# Patient Record
Sex: Female | Born: 1950
Health system: Southern US, Community
[De-identification: ages and names within clinical notes are randomized; demographics above are authoritative.]

## PROBLEM LIST (undated history)

## (undated) DIAGNOSIS — E039 Hypothyroidism, unspecified: Secondary | ICD-10-CM

## (undated) DIAGNOSIS — C541 Malignant neoplasm of endometrium: Secondary | ICD-10-CM

## (undated) DIAGNOSIS — Z860100 Personal history of colon polyps, unspecified: Secondary | ICD-10-CM

## (undated) DIAGNOSIS — N950 Postmenopausal bleeding: Secondary | ICD-10-CM

## (undated) DIAGNOSIS — C55 Malignant neoplasm of uterus, part unspecified: Secondary | ICD-10-CM

## (undated) HISTORY — DX: Personal history of colon polyps, unspecified: Z86.0100

## (undated) HISTORY — DX: Hypothyroidism, unspecified: E03.9

## (undated) HISTORY — DX: Personal history of colonic polyps: Z86.010

---

## 2005-05-17 HISTORY — PX: COLONOSCOPY: SHX174

## 2005-11-22 ENCOUNTER — Ambulatory Visit: Payer: Self-pay | Admitting: Family Medicine

## 2005-12-14 ENCOUNTER — Ambulatory Visit: Payer: Self-pay | Admitting: Family Medicine

## 2005-12-15 ENCOUNTER — Ambulatory Visit: Payer: Self-pay | Admitting: Internal Medicine

## 2005-12-28 ENCOUNTER — Encounter: Payer: Self-pay | Admitting: Gastroenterology

## 2005-12-28 ENCOUNTER — Ambulatory Visit: Payer: Self-pay | Admitting: Gastroenterology

## 2005-12-28 LAB — HM COLONOSCOPY

## 2007-01-24 ENCOUNTER — Ambulatory Visit: Payer: Self-pay | Admitting: Family Medicine

## 2008-03-06 ENCOUNTER — Ambulatory Visit: Payer: Self-pay | Admitting: Family Medicine

## 2008-07-09 ENCOUNTER — Ambulatory Visit: Payer: Self-pay | Admitting: Family Medicine

## 2008-11-26 ENCOUNTER — Ambulatory Visit: Payer: Self-pay | Admitting: Family Medicine

## 2009-02-26 ENCOUNTER — Ambulatory Visit: Payer: Self-pay | Admitting: Family Medicine

## 2010-04-21 ENCOUNTER — Ambulatory Visit: Payer: Self-pay | Admitting: Family Medicine

## 2011-01-11 ENCOUNTER — Encounter: Payer: Self-pay | Admitting: Gastroenterology

## 2011-04-28 ENCOUNTER — Telehealth: Payer: Self-pay | Admitting: Family Medicine

## 2011-04-28 MED ORDER — LEVOTHYROXINE SODIUM 137 MCG PO TABS: 137.0000 ug | ORAL_TABLET | Freq: Every day | ORAL | Status: DC

## 2011-04-28 NOTE — Telephone Encounter (Signed)
Med sent in.

## 2011-04-28 NOTE — Telephone Encounter (Signed)
Med called in

## 2011-05-27 ENCOUNTER — Encounter: Payer: Self-pay | Admitting: Internal Medicine

## 2011-06-04 ENCOUNTER — Encounter: Payer: Self-pay | Admitting: Family Medicine

## 2011-06-04 ENCOUNTER — Telehealth: Payer: Self-pay | Admitting: Internal Medicine

## 2011-06-04 MED ORDER — LEVOTHYROXINE SODIUM 137 MCG PO TABS: 137.0000 ug | ORAL_TABLET | Freq: Every day | ORAL | Status: DC

## 2011-06-04 NOTE — Telephone Encounter (Signed)
[facility] [place]

## 2011-06-05 ENCOUNTER — Other Ambulatory Visit: Payer: Self-pay | Admitting: Family Medicine

## 2011-06-18 ENCOUNTER — Encounter: Payer: Self-pay | Admitting: Family Medicine

## 2011-06-25 ENCOUNTER — Ambulatory Visit (INDEPENDENT_AMBULATORY_CARE_PROVIDER_SITE_OTHER): Payer: 59 | Admitting: Family Medicine

## 2011-06-25 ENCOUNTER — Encounter: Payer: Self-pay | Admitting: Family Medicine

## 2011-06-25 ENCOUNTER — Other Ambulatory Visit (HOSPITAL_COMMUNITY)
Admission: RE | Admit: 2011-06-25 | Discharge: 2011-06-25 | Disposition: A | Discharge status: Home / Self Care | Payer: 59 | Source: Ambulatory Visit | Attending: Family Medicine | Admitting: Family Medicine

## 2011-06-25 DIAGNOSIS — Z23 Encounter for immunization: Secondary | ICD-10-CM

## 2011-06-25 DIAGNOSIS — Z01419 Encounter for gynecological examination (general) (routine) without abnormal findings: Secondary | ICD-10-CM | POA: Insufficient documentation

## 2011-06-25 DIAGNOSIS — Z0000 Encounter for general adult medical examination without abnormal findings: Secondary | ICD-10-CM

## 2011-06-25 DIAGNOSIS — E039 Hypothyroidism, unspecified: Secondary | ICD-10-CM

## 2011-06-25 LAB — POC HEMOCCULT BLD/STL (OFFICE/1-CARD/DIAGNOSTIC): Fecal Occult Blood, POC: NEGATIVE

## 2011-06-25 NOTE — Progress Notes (Signed)
  Subjective:    Patient ID: Diane [name], female    DOB: Nov 25, 1950, 61 y.o.   MRN: [id number]  HPI She is here for a complete examination. She was married last May and now has health insurance. She has no particular concerns or complaints. She continues on Synthroid and is having no problems with that. She has not had Pap, pelvic or mammogram.   Review of Systems  Constitutional: Negative.   HENT: Negative.   Eyes: Negative.   Respiratory: Negative.   Cardiovascular: Negative.   Gastrointestinal: Negative.   Genitourinary: Negative.   Musculoskeletal: Negative.   Skin: Negative.   Neurological: Negative.   Hematological: Negative.   Psychiatric/Behavioral: Negative.        Objective:   Physical Exam BP 118/70  Pulse 76  Ht 5' 7.5" (1.715 m)  Wt 128 lb (58.06 kg)  BMI 19.75 kg/m2  General Appearance:    Alert, cooperative, no distress, appears stated age  Head:    Normocephalic, without obvious abnormality, atraumatic  Eyes:    PERRL, conjunctiva/corneas clear, EOM's intact, fundi    benign  Ears:    Normal TM's and external ear canals  Nose:   Nares normal, mucosa normal, no drainage or sinus   tenderness  Throat:   Lips, mucosa, and tongue normal; teeth and gums normal  Neck:   Supple, no lymphadenopathy;  thyroid:  no   enlargement/tenderness/nodules; no carotid   bruit or JVD  Back:    Spine nontender, no curvature, ROM normal, no CVA     tenderness  Lungs:     Clear to auscultation bilaterally without wheezes, rales or     ronchi; respirations unlabored  Chest Wall:    No tenderness or deformity   Heart:    Regular rate and rhythm, S1 and S2 normal, no murmur, rub   or gallop  Breast Exam:   deferred   Abdomen:     Soft, non-tender, nondistended, normoactive bowel sounds,    no masses, no hepatosplenomegaly  Genitalia:    Normal external genitalia without lesions.  BUS and vagina normal; cervix without lesions, or cervical motion tenderness. No abnormal vaginal  discharge.  Uterus and adnexa not enlarged, nontender, no masses.  Pap performed  Rectal:    Normal tone, no masses or tenderness; guaiac negative stool  Extremities:   No clubbing, cyanosis or edema  Pulses:   2+ and symmetric all extremities  Skin:   Skin color, texture, turgor normal, no rashes or lesions  Lymph nodes:   Cervical, supraclavicular, and axillary nodes normal  Neurologic:   CNII-XII intact, normal strength, sensation and gait; reflexes 2+ and symmetric throughout          Psych:   Normal mood, affect, hygiene and grooming.          Assessment & Plan:   1. Routine general medical examination at a health care facility  CBC with Differential, Comprehensive metabolic panel, Lipid panel, POCT Hemoccult (POC) Blood/Stool Test, Cytology - PAP, MM Digital Screening  2. Hypothyroid  TSH

## 2011-06-26 LAB — COMPREHENSIVE METABOLIC PANEL
ALT: 18 U/L (ref 0–35)
AST: 26 U/L (ref 0–37)
Albumin: 4.5 g/dL (ref 3.5–5.2)
BUN: 17 mg/dL (ref 6–23)
CO2: 26 mEq/L (ref 19–32)
Calcium: 9.7 mg/dL (ref 8.4–10.5)
Chloride: 102 mEq/L (ref 96–112)
Potassium: 4 mEq/L (ref 3.5–5.3)

## 2011-06-26 LAB — CBC WITH DIFFERENTIAL/PLATELET
Basophils Absolute: 0 10*3/uL (ref 0.0–0.1)
HCT: 43 % (ref 36.0–46.0)
Lymphocytes Relative: 25 % (ref 12–46)
Lymphs Abs: 1.1 10*3/uL (ref 0.7–4.0)
MCV: 97.7 fL (ref 78.0–100.0)
Monocytes Absolute: 0.4 10*3/uL (ref 0.1–1.0)
Neutro Abs: 2.7 10*3/uL (ref 1.7–7.7)
RBC: 4.4 MIL/uL (ref 3.87–5.11)
RDW: 13.4 % (ref 11.5–15.5)
WBC: 4.3 10*3/uL (ref 4.0–10.5)

## 2011-06-26 LAB — TSH: TSH: 3.329 u[IU]/mL (ref 0.350–4.500)

## 2011-06-26 LAB — LIPID PANEL: Cholesterol: 223 mg/dL — ABNORMAL HIGH (ref 0–200)

## 2011-06-28 NOTE — Progress Notes (Signed)
Quick Note:  The blood work is normal ______ 

## 2011-08-20 LAB — HM MAMMOGRAPHY

## 2011-08-30 ENCOUNTER — Encounter: Payer: Self-pay | Admitting: Family Medicine

## 2011-11-08 ENCOUNTER — Telehealth: Payer: Self-pay | Admitting: Family Medicine

## 2011-11-08 MED ORDER — LEVOTHYROXINE SODIUM 137 MCG PO TABS: 137.0000 ug | ORAL_TABLET | Freq: Every day | ORAL | Status: DC

## 2011-11-08 NOTE — Telephone Encounter (Signed)
Let her know that the med was renewed for one-year

## 2011-11-08 NOTE — Telephone Encounter (Signed)
Called pt to let her know med renewed for 1 yr

## 2011-11-08 NOTE — Telephone Encounter (Signed)
PT STATES THIS MED IS USUALLY FILLED FOR A YEAR AND AT LAST VISIT IT WASN'T. I DIDN'T SEE ANYTHING COMMENTED ON IN RECORDS AS TO WHY.  PT NEEDS REFILL ON SYNTHROID SENT TO [facility]

## 2011-11-20 ENCOUNTER — Other Ambulatory Visit: Payer: Self-pay | Admitting: Family Medicine

## 2012-08-18 ENCOUNTER — Other Ambulatory Visit: Payer: BC Managed Care – PPO

## 2012-08-18 DIAGNOSIS — B351 Tinea unguium: Secondary | ICD-10-CM

## 2012-08-18 LAB — CBC WITH DIFFERENTIAL/PLATELET
Basophils Absolute: 0 10*3/uL (ref 0.0–0.1)
Basophils Relative: 1 % (ref 0–1)
Hemoglobin: 12.6 g/dL (ref 12.0–15.0)
MCHC: 33.1 g/dL (ref 30.0–36.0)
Monocytes Relative: 8 % (ref 3–12)
Neutro Abs: 2.3 10*3/uL (ref 1.7–7.7)
Neutrophils Relative %: 59 % (ref 43–77)
WBC: 3.9 10*3/uL — ABNORMAL LOW (ref 4.0–10.5)

## 2012-08-18 LAB — COMPREHENSIVE METABOLIC PANEL
AST: 32 U/L (ref 0–37)
Albumin: 4.2 g/dL (ref 3.5–5.2)
Alkaline Phosphatase: 56 U/L (ref 39–117)
Glucose, Bld: 64 mg/dL — ABNORMAL LOW (ref 70–99)
Potassium: 4 mEq/L (ref 3.5–5.3)
Sodium: 142 mEq/L (ref 135–145)
Total Protein: 6.2 g/dL (ref 6.0–8.3)

## 2012-08-28 ENCOUNTER — Telehealth: Payer: Self-pay | Admitting: Family Medicine

## 2012-08-28 NOTE — Telephone Encounter (Signed)
dt ?

## 2012-11-22 ENCOUNTER — Telehealth: Payer: Self-pay | Admitting: Internal Medicine

## 2012-11-22 NOTE — Telephone Encounter (Signed)
Pt has an appt for a physical august 15 but needs a refill on her synthroid to get to her appt .Marland Kitchen [facility] [place]

## 2012-11-23 ENCOUNTER — Other Ambulatory Visit: Payer: Self-pay

## 2012-11-23 MED ORDER — LEVOTHYROXINE SODIUM 137 MCG PO TABS: ORAL_TABLET | ORAL | Status: DC

## 2012-11-23 NOTE — Telephone Encounter (Signed)
SENT IN THYROID MED TO [facility] TILL APPT.

## 2012-11-28 ENCOUNTER — Other Ambulatory Visit: Payer: Self-pay | Admitting: Family Medicine

## 2012-12-29 ENCOUNTER — Ambulatory Visit (INDEPENDENT_AMBULATORY_CARE_PROVIDER_SITE_OTHER): Payer: BC Managed Care – PPO | Admitting: Family Medicine

## 2012-12-29 ENCOUNTER — Encounter: Payer: Self-pay | Admitting: Family Medicine

## 2012-12-29 VITALS — BP 120/60 | HR 77 | Ht 67.5 in | Wt 124.0 lb

## 2012-12-29 DIAGNOSIS — E039 Hypothyroidism, unspecified: Secondary | ICD-10-CM

## 2012-12-29 DIAGNOSIS — Z0000 Encounter for general adult medical examination without abnormal findings: Secondary | ICD-10-CM

## 2012-12-29 LAB — CBC WITH DIFFERENTIAL/PLATELET
Basophils Absolute: 0 10*3/uL (ref 0.0–0.1)
Basophils Relative: 1 % (ref 0–1)
Eosinophils Absolute: 0.1 10*3/uL (ref 0.0–0.7)
MCH: 32.4 pg (ref 26.0–34.0)
MCHC: 34.4 g/dL (ref 30.0–36.0)
Neutro Abs: 1.9 10*3/uL (ref 1.7–7.7)
Neutrophils Relative %: 56 % (ref 43–77)
Platelets: 214 10*3/uL (ref 150–400)
RDW: 14.1 % (ref 11.5–15.5)

## 2012-12-29 NOTE — Progress Notes (Signed)
  Subjective:    Patient ID: Diane [name], female    DOB: 03-May-1951, 62 y.o.   MRN: [id number]  HPI He is here for complete examination. She has no particular concerns or complaints. She continues on her thyroid medication and is having no difficulty with that. Social and family history were reviewed. She is married for the third time and has been married this time for 2 years. It is going quite well. She seems very happy. 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   Review of Systems  Constitutional: Negative.   HENT: Negative.   Eyes: Negative.   Respiratory: Negative.   Cardiovascular: Negative.   Gastrointestinal: Negative.   Endocrine: Negative.   Genitourinary: Negative.   Musculoskeletal: Negative.   Allergic/Immunologic: Negative.   Neurological: Negative.   Hematological: Negative.   Psychiatric/Behavioral: Negative.        Objective:   Physical Exam BP 120/60  Pulse 77  Ht 5' 7.5" (1.715 m)  Wt 124 lb (56.246 kg)  BMI 19.12 kg/m2  General Appearance:    Alert, cooperative, no distress, appears stated age  Head:    Normocephalic, without obvious abnormality, atraumatic  Eyes:    PERRL, conjunctiva/corneas clear, EOM's intact, fundi    benign  Ears:    Normal TM's and external ear canals  Nose:   Nares normal, mucosa normal, no drainage or sinus   tenderness  Throat:   Lips, mucosa, and tongue normal; teeth and gums normal  Neck:   Supple, no lymphadenopathy;  thyroid:  no   enlargement/tenderness/nodules; no carotid   bruit or JVD  Back:    Spine nontender, no curvature, ROM normal, no CVA     tenderness  Lungs:     Clear to auscultation bilaterally without wheezes, rales or     ronchi; respirations unlabored  Chest Wall:    No tenderness or deformity   Heart:    Regular rate and rhythm, S1 and S2 normal, no murmur, rub   or gallop  Breast Exam:    Deferred to GYN  Abdomen:     Soft, non-tender, nondistended, normoactive bowel sounds,    no masses, no  hepatosplenomegaly  Genitalia:    Deferred to GYN     Extremities:   No clubbing, cyanosis or edema  Pulses:   2+ and symmetric all extremities  Skin:   Skin color, texture, turgor normal, no rashes or lesions  Lymph nodes:   Cervical, supraclavicular, and axillary nodes normal  Neurologic:   CNII-XII intact, normal strength, sensation and gait; reflexes 2+ and symmetric throughout          Psych:   Normal mood, affect, hygiene and grooming.          Assessment & Plan:  Routine general medical examination at a health care facility - Plan: CBC with Differential, Comprehensive metabolic panel, Lipid panel  Hypothyroid - Plan: TSH

## 2012-12-30 LAB — COMPREHENSIVE METABOLIC PANEL
AST: 42 U/L — ABNORMAL HIGH (ref 0–37)
Albumin: 4.3 g/dL (ref 3.5–5.2)
Alkaline Phosphatase: 59 U/L (ref 39–117)
Potassium: 3.8 mEq/L (ref 3.5–5.3)
Sodium: 139 mEq/L (ref 135–145)
Total Bilirubin: 0.6 mg/dL (ref 0.3–1.2)
Total Protein: 6.6 g/dL (ref 6.0–8.3)

## 2012-12-30 LAB — TSH: TSH: 2.485 u[IU]/mL (ref 0.350–4.500)

## 2012-12-30 LAB — LIPID PANEL
HDL: 83 mg/dL (ref >39)
LDL Cholesterol: 96 mg/dL (ref 0–99)
Triglycerides: 59 mg/dL (ref <150)
VLDL: 12 mg/dL (ref 0–40)

## 2013-11-07 ENCOUNTER — Encounter: Payer: Self-pay | Admitting: Medical

## 2013-11-07 ENCOUNTER — Ambulatory Visit (INDEPENDENT_AMBULATORY_CARE_PROVIDER_SITE_OTHER): Payer: BC Managed Care – PPO | Admitting: Medical

## 2013-11-07 VITALS — BP 92/60 | HR 68 | Temp 97.9°F | Resp 14 | Wt 125.0 lb

## 2013-11-07 DIAGNOSIS — E039 Hypothyroidism, unspecified: Secondary | ICD-10-CM

## 2013-11-07 DIAGNOSIS — R5381 Other malaise: Secondary | ICD-10-CM

## 2013-11-07 DIAGNOSIS — R5383 Other fatigue: Secondary | ICD-10-CM

## 2013-11-07 LAB — BASIC METABOLIC PANEL
BUN: 16 mg/dL (ref 6–23)
CHLORIDE: 105 meq/L (ref 96–112)
CO2: 29 mEq/L (ref 19–32)
Calcium: 9.6 mg/dL (ref 8.4–10.5)
Creat: 0.89 mg/dL (ref 0.50–1.10)
GLUCOSE: 88 mg/dL (ref 70–99)
POTASSIUM: 5 meq/L (ref 3.5–5.3)
Sodium: 141 mEq/L (ref 135–145)

## 2013-11-07 LAB — CBC
HEMATOCRIT: 39.5 % (ref 36.0–46.0)
Hemoglobin: 13.3 g/dL (ref 12.0–15.0)
MCH: 32 pg (ref 26.0–34.0)
MCHC: 33.7 g/dL (ref 30.0–36.0)
MCV: 95.2 fL (ref 78.0–100.0)
Platelets: 185 10*3/uL (ref 150–400)
RBC: 4.15 MIL/uL (ref 3.87–5.11)
RDW: 14.3 % (ref 11.5–15.5)
WBC: 3.7 10*3/uL — AB (ref 4.0–10.5)

## 2013-11-07 LAB — TSH: TSH: 1.221 u[IU]/mL (ref 0.350–4.500)

## 2013-11-07 LAB — T4, FREE: FREE T4: 1.38 ng/dL (ref 0.80–1.80)

## 2013-11-07 NOTE — Progress Notes (Addendum)
   Subjective:   Diane [name] is a 63 y.o. female presenting on 11/07/2013 with thyroid check, fatigue, weak legs  Here for recheck on thyroid.  normally sees Dr. Redmond School here.  For the past several months, feeling tired, heavy feeling.  Has had this in the past when first diagnosed with thyroid issues.  No other symptoms.  Exercises regularly, no CP, DOE, no leg swelling, no dyspnea.  Legs feel heavy.  Has been on Synthroid 164mcg, name brand, for a while now, probably 3 years.  Was on 163mcg in the past for a long time.   Denies depression, mood changes.   Married.   Eats healthy.   Works Education administrator houses.   No other aggravating or relieving factors.  No other complaint.  Review of Systems Constitutional: -fever, -chills, -sweats, -unexpected weight change, +fatigue ENT: -runny nose, -ear pain, -sore throat Cardiology:  -chest pain, -palpitations, -edema Respiratory: -cough, -shortness of breath, -wheezing Gastroenterology: -abdominal pain, -nausea, -vomiting, -diarrhea, -constipation  Hematology: -bleeding or bruising problems Musculoskeletal: -arthralgias, -myalgias, -joint swelling, -back pain Ophthalmology: -vision changes Urology: -dysuria, -difficulty urinating, -hematuria, -urinary frequency, -urgency Neurology: -headache, -weakness, -tingling, -numbness No vaginal bleeding.       Objective:    BP 92/60  Pulse 68  Temp(Src) 97.9 F (36.6 C) (Oral)  Resp 14  Wt 125 lb (56.7 kg) Wt Readings from Last 3 Encounters:  11/07/13 125 lb (56.7 kg)  12/29/12 124 lb (56.246 kg)  06/25/11 128 lb (58.06 kg)    General appearance: alert, no distress, WD/WN, lean white female HEENT: normocephalic, sclerae anicteric, TMs pearly, nares patent, no discharge or erythema, pharynx normal Oral cavity: MMM, no lesions Neck: supple, no lymphadenopathy, no thyromegaly, no masses Heart: RRR, normal S1, S2, no murmurs Lungs: CTA bilaterally, no wheezes, rhonchi, or rales Abdomen: +bs, soft,  non tender, non distended, no masses, no hepatomegaly, no splenomegaly Pulses: 1+ symmetric, upper and lower extremities, normal cap refill Ext: no edema    Adult ECG Report  Indication: fatigue  Rate: 56bpm  Rhythm: sinus bradycardia  QRS Axis: 82 degrees  PR Interval: 19ms  QRS Duration: 37ms  QTc: 42ms  Conduction Disturbances: RSR' in V1, V2, possible incomplete RBBB, otherwise good R wave progression, no other worrisome changes  Other Abnormalities: none  Patient's cardiac risk factors are: none.  EKG comparison: none  Narrative Interpretation: sinus bradycardia, otherwise normal EKG      Assessment: Encounter Diagnoses  Name Primary?  Marland Kitchen Unspecified hypothyroidism Yes  . Other malaise and fatigue      Plan: She seems to be compliant with medication, taking it properly.  No recent diet, exercise or lifestyle changes. EKG and labs today to further eval.  F/u pending studies.  Diane [name] was seen today for thyroid check, fatigue, weak legs.  Diagnoses and associated orders for this visit:  Unspecified hypothyroidism - CBC - TSH - Basic metabolic panel - T4, free - EKG 12-Lead - PR ELECTROCARDIOGRAM, COMPLETE  Other malaise and fatigue - CBC - TSH - Basic metabolic panel - T4, free - EKG 12-Lead - PR ELECTROCARDIOGRAM, COMPLETE    Return pending labs.

## 2013-11-09 ENCOUNTER — Other Ambulatory Visit: Payer: Self-pay | Admitting: Medical

## 2013-11-09 MED ORDER — LEVOTHYROXINE SODIUM 150 MCG PO TABS: 150.0000 ug | ORAL_TABLET | Freq: Every day | ORAL | Status: DC

## 2014-01-29 ENCOUNTER — Ambulatory Visit (INDEPENDENT_AMBULATORY_CARE_PROVIDER_SITE_OTHER): Payer: BC Managed Care – PPO | Admitting: Medical

## 2014-01-29 ENCOUNTER — Encounter: Payer: Self-pay | Admitting: Medical

## 2014-01-29 VITALS — BP 110/80 | HR 56 | Temp 97.7°F | Resp 14 | Wt 126.0 lb

## 2014-01-29 DIAGNOSIS — E039 Hypothyroidism, unspecified: Secondary | ICD-10-CM

## 2014-01-29 DIAGNOSIS — Z2821 Immunization not carried out because of patient refusal: Secondary | ICD-10-CM

## 2014-01-29 DIAGNOSIS — R5383 Other fatigue: Secondary | ICD-10-CM

## 2014-01-29 DIAGNOSIS — R5381 Other malaise: Secondary | ICD-10-CM

## 2014-01-29 DIAGNOSIS — Z7189 Other specified counseling: Secondary | ICD-10-CM

## 2014-01-29 MED ORDER — LEVOTHYROXINE SODIUM 150 MCG PO TABS: 150.0000 ug | ORAL_TABLET | Freq: Every day | ORAL | Status: DC

## 2014-01-29 NOTE — Patient Instructions (Signed)
Look into Shingles vaccine  Consider Bone Density scan at your next physical  Try and take 1000 IU Vit D daily, and 1200mg  Calcium daily.   Osteoporosis Throughout your life, your body breaks down old bone and replaces it with new bone. As you get older, your body does not replace bone as quickly as it breaks it down. By the age of 74 years, most people begin to gradually lose bone because of the imbalance between bone loss and replacement. Some people lose more bone than others. Bone loss beyond a specified normal degree is considered osteoporosis.  Osteoporosis affects the strength and durability of your bones. The inside of the ends of your bones and your flat bones, like the bones of your pelvis, look like honeycomb, filled with tiny open spaces. As bone loss occurs, your bones become less dense. This means that the open spaces inside your bones become bigger and the walls between these spaces become thinner. This makes your bones weaker. Bones of a person with osteoporosis can become so weak that they can break (fracture) during minor accidents, such as a simple fall. CAUSES  The following factors have been associated with the development of osteoporosis:  Smoking.  Drinking more than 2 alcoholic drinks several days per week.  Long-term use of certain medicines:  Corticosteroids.  Chemotherapy medicines.  Thyroid medicines.  Antiepileptic medicines.  Gonadal hormone suppression medicine.  Immunosuppression medicine.  Being underweight.  Lack of physical activity.  Lack of exposure to the sun. This can lead to vitamin D deficiency.  Certain medical conditions:  Certain inflammatory bowel diseases, such as Crohn disease and ulcerative colitis.  Diabetes.  Hyperthyroidism.  Hyperparathyroidism. RISK FACTORS Anyone can develop osteoporosis. However, the following factors can increase your risk of developing osteoporosis:  Gender--Women are at higher risk than  men.  Age--Being older than 50 years increases your risk.  Ethnicity--White and Asian people have an increased risk.  Weight --Being extremely underweight can increase your risk of osteoporosis.  Family history of osteoporosis--Having a family member who has developed osteoporosis can increase your risk. SYMPTOMS  Usually, people with osteoporosis have no symptoms.  DIAGNOSIS  Signs during a physical exam that may prompt your caregiver to suspect osteoporosis include:  Decreased height. This is usually caused by the compression of the bones that form your spine (vertebrae) because they have weakened and become fractured.  A curving or rounding of the upper back (kyphosis). To confirm signs of osteoporosis, your caregiver may request a procedure that uses 2 low-dose X-ray beams with different levels of energy to measure your bone mineral density (dual-energy X-ray absorptiometry [DXA]). Also, your caregiver may check your level of vitamin D. TREATMENT  The goal of osteoporosis treatment is to strengthen bones in order to decrease the risk of bone fractures. There are different types of medicines available to help achieve this goal. Some of these medicines work by slowing the processes of bone loss. Some medicines work by increasing bone density. Treatment also involves making sure that your levels of calcium and vitamin D are adequate. PREVENTION  There are things you can do to help prevent osteoporosis. Adequate intake of calcium and vitamin D can help you achieve optimal bone mineral density. Regular exercise can also help, especially resistance and weight-bearing activities. If you smoke, quitting smoking is an important part of osteoporosis prevention. MAKE SURE YOU:  Understand these instructions.  Will watch your condition.  Will get help right away if you are not doing well  or get worse. FOR MORE INFORMATION www.osteo.org and EquipmentWeekly.com.ee Document Released: 02/10/2005 Document  Revised: 08/28/2012 Document Reviewed: 04/17/2011 [facility] Patient Information 2015 [place], [place]. This information is not intended to replace advice given to you by your health care provider. Make sure you discuss any questions you have with your health care provider.

## 2014-01-29 NOTE — Progress Notes (Signed)
Subjective: Here for f/u on thyroid medication.  After last visit having fatigue, no energy, we increased to Synthroid 141mcg from 174mcg.   She notes feeling much improvement.   Feels good at this dose, no particular c/o.   Wants to remain on this dose.   No side effects of the medication.  No chest pain, no palpitations, no edema.  No other c/o   Objective: Filed Vitals:   01/29/14 0824  BP: 110/80  Pulse: 56  Temp: 97.7 F (36.5 C)  Resp: 14    General appearance: alert, no distress, WD/WN, lean white female Neck: supple, no lymphadenopathy, no thyromegaly, no masses Heart: RRR, normal S1, S2, no murmurs Lungs: CTA bilaterally, no wheezes, rhonchi, or rales Pulses: 2+ symmetric, upper and lower extremities, normal cap refill Ext: no edema  Assessment: Encounter Diagnoses  Name Primary?  Marland Kitchen Unspecified hypothyroidism Yes  . Other malaise and fatigue   . Counseling on health promotion and disease prevention   . Influenza vaccination declined    Plan: Hypothyroidism - much improved on current dose.  She is doing much better, would not want to change the dose even if it was a little outside the normal range at this point.  She declines labs today.  Plan to recheck thyroid labs in January after the first of the year  Fatigue-resolved.  Discussed causes of fatigue other than hypothyroidism so she is aware the differential and ways we approach this  Counseled on vaccinations, shingles vaccine flu vaccine, bone to screening which she has never had.  Counseled on calcium vitamin D. Continue good exercise and weightbearing exercise, continue healthy diet  She declines influenza vaccine  Return in January for physical, labs

## 2014-02-05 ENCOUNTER — Other Ambulatory Visit: Payer: Self-pay | Admitting: Medical

## 2014-04-18 ENCOUNTER — Telehealth: Payer: Self-pay | Admitting: Family Medicine

## 2014-04-18 MED ORDER — LEVOTHYROXINE SODIUM 150 MCG PO TABS: 150.0000 ug | ORAL_TABLET | Freq: Every day | ORAL | Status: DC

## 2014-04-18 NOTE — Telephone Encounter (Signed)
Done

## 2014-05-06 ENCOUNTER — Other Ambulatory Visit: Payer: Self-pay | Admitting: Family Medicine

## 2014-06-18 ENCOUNTER — Ambulatory Visit (INDEPENDENT_AMBULATORY_CARE_PROVIDER_SITE_OTHER): Payer: BLUE CROSS/BLUE SHIELD | Admitting: Family Medicine

## 2014-06-18 ENCOUNTER — Encounter: Payer: Self-pay | Admitting: Family Medicine

## 2014-06-18 VITALS — BP 114/70 | HR 78 | Ht 68.0 in | Wt 126.0 lb

## 2014-06-18 DIAGNOSIS — Z1239 Encounter for other screening for malignant neoplasm of breast: Secondary | ICD-10-CM | POA: Diagnosis not present

## 2014-06-18 DIAGNOSIS — E039 Hypothyroidism, unspecified: Secondary | ICD-10-CM | POA: Diagnosis not present

## 2014-06-18 DIAGNOSIS — Z0000 Encounter for general adult medical examination without abnormal findings: Secondary | ICD-10-CM

## 2014-06-18 LAB — POCT URINALYSIS DIPSTICK
BILIRUBIN UA: NEGATIVE
Blood, UA: NEGATIVE
Glucose, UA: NEGATIVE
Ketones, UA: NEGATIVE
Leukocytes, UA: NEGATIVE
Nitrite, UA: NEGATIVE
Protein, UA: NEGATIVE
Spec Grav, UA: 1.015
UROBILINOGEN UA: NEGATIVE
pH, UA: 6

## 2014-06-18 NOTE — Progress Notes (Signed)
   Subjective:    Patient ID: Diane [name], female    DOB: [birth year], 64 y.o.   MRN: [id number]  HPI She is here for an annual exam and is without complaints. She states she is feeling well and denies recent illness or injury. She is staying active and getting more than 150 minutes of activity weekly, and eating a healthy diet. She lives with her husband and states she has a good home life.   Social and family history was reviewed. Health maintenance and immunizations were also discussed.  Review of Systems  All other systems reviewed and are negative.       Objective:   Physical Exam  Alert and in no distress. Tympanic membranes and canals are normal. Throat is clear. Tonsils are normal. Neck is supple without adenopathy or thyromegaly. Cardiac exam shows a regular sinus rhythm without murmurs or gallops. Lungs are clear to auscultation. Abdominal exam shows no hepatosplenomegaly masses or tenderness with normal bowel sounds . Breast and pelvic deferred. Previous blood work was reviewed.      Assessment & Plan:  Routine general medical examination at a health care facility - Plan: POCT Urinalysis Dipstick  Hypothyroidism, unspecified hypothyroidism type  Screening for breast cancer - Plan: MM DIGITAL SCREENING BILATERAL  she did not want a flu shot and shingles vaccine was discussed however she do not want that as well as. She will continue on her present thyroid medication. Encouraged her to continue to take good care of herself.

## 2014-07-29 ENCOUNTER — Other Ambulatory Visit: Payer: Self-pay | Admitting: Medical

## 2014-10-04 ENCOUNTER — Telehealth: Payer: Self-pay | Admitting: Family Medicine

## 2014-10-04 ENCOUNTER — Other Ambulatory Visit: Payer: Self-pay | Admitting: Family Medicine

## 2014-10-04 MED ORDER — LEVOTHYROXINE SODIUM 150 MCG PO TABS: 150.0000 ug | ORAL_TABLET | Freq: Every day | ORAL | Status: DC

## 2014-10-04 NOTE — Telephone Encounter (Signed)
Rx for thyroid medication was sent to her pharmacy

## 2014-10-04 NOTE — Telephone Encounter (Signed)
Requesting refill on Synthroid 150MCG

## 2014-10-31 ENCOUNTER — Other Ambulatory Visit: Payer: Self-pay | Admitting: Medical

## 2014-10-31 ENCOUNTER — Other Ambulatory Visit: Payer: Self-pay | Admitting: Family Medicine

## 2015-01-29 ENCOUNTER — Other Ambulatory Visit: Payer: Self-pay | Admitting: Family Medicine

## 2015-02-17 ENCOUNTER — Encounter: Payer: Self-pay | Admitting: Family Medicine

## 2015-02-17 ENCOUNTER — Ambulatory Visit (INDEPENDENT_AMBULATORY_CARE_PROVIDER_SITE_OTHER): Payer: BLUE CROSS/BLUE SHIELD | Admitting: Family Medicine

## 2015-02-17 VITALS — BP 100/70 | HR 61 | Ht 68.0 in | Wt 125.0 lb

## 2015-02-17 DIAGNOSIS — Z1159 Encounter for screening for other viral diseases: Secondary | ICD-10-CM

## 2015-02-17 DIAGNOSIS — E039 Hypothyroidism, unspecified: Secondary | ICD-10-CM

## 2015-02-17 DIAGNOSIS — Z7189 Other specified counseling: Secondary | ICD-10-CM

## 2015-02-17 LAB — TSH: TSH: 0.141 u[IU]/mL — AB (ref 0.350–4.500)

## 2015-02-17 NOTE — Progress Notes (Signed)
   Subjective:    Patient ID: Diane [name], female    DOB: 1951/01/07, 64 y.o.   MRN: [id number]  HPI She is here for medication check. She has no particular concerns or questions. The only medication she is on is for her thyroid. Flu shot and shingles shots were suggested however she refused. She's had no shortness of breath, chest pain, GI issues. Review of the record indicates she needs a Pap done. She has had mammogram and colonoscopy.   Review of Systems     Objective:   Physical Exam Alert and in no distress. Tympanic membranes and canals are normal. Pharyngeal area is normal. Neck is supple without adenopathy or thyromegaly. Cardiac exam shows a regular sinus rhythm without murmurs or gallops. Lungs are clear to auscultation.       Assessment & Plan:  Hypothyroidism, unspecified hypothyroidism type - Plan: TSH  Counseling on health promotion and disease prevention  Need for hepatitis C screening test - Plan: Hepatitis C Antibody  she will return here next year for a Pap as she does not want one issue. Again immunizations were recommended however she refused.

## 2015-02-18 LAB — HEPATITIS C ANTIBODY: HCV AB: NEGATIVE

## 2015-02-18 MED ORDER — LEVOTHYROXINE SODIUM 125 MCG PO TABS: 125.0000 ug | ORAL_TABLET | Freq: Every day | ORAL | Status: DC

## 2015-02-18 NOTE — Addendum Note (Signed)
Addended by: Denita Lung on: 02/18/2015 08:13 AM   Modules accepted: Orders

## 2015-04-21 ENCOUNTER — Other Ambulatory Visit: Payer: BLUE CROSS/BLUE SHIELD

## 2015-04-21 DIAGNOSIS — E039 Hypothyroidism, unspecified: Secondary | ICD-10-CM

## 2015-04-21 LAB — TSH: TSH: 1.648 u[IU]/mL (ref 0.350–4.500)

## 2015-12-03 ENCOUNTER — Encounter: Payer: Self-pay | Admitting: Gastroenterology

## 2016-02-06 ENCOUNTER — Other Ambulatory Visit: Payer: Self-pay | Admitting: Family Medicine

## 2016-03-05 ENCOUNTER — Other Ambulatory Visit: Payer: Self-pay | Admitting: Family Medicine

## 2016-04-01 ENCOUNTER — Other Ambulatory Visit: Payer: Self-pay | Admitting: Family Medicine

## 2016-04-05 ENCOUNTER — Institutional Professional Consult (permissible substitution): Payer: BLUE CROSS/BLUE SHIELD | Admitting: Family Medicine

## 2016-04-14 ENCOUNTER — Ambulatory Visit (INDEPENDENT_AMBULATORY_CARE_PROVIDER_SITE_OTHER): Payer: BLUE CROSS/BLUE SHIELD | Admitting: Family Medicine

## 2016-04-14 ENCOUNTER — Encounter: Payer: Self-pay | Admitting: Family Medicine

## 2016-04-14 VITALS — BP 110/60 | HR 56 | Resp 16 | Wt 130.8 lb

## 2016-04-14 DIAGNOSIS — E039 Hypothyroidism, unspecified: Secondary | ICD-10-CM

## 2016-04-14 DIAGNOSIS — Z7189 Other specified counseling: Secondary | ICD-10-CM | POA: Diagnosis not present

## 2016-04-14 DIAGNOSIS — Z79899 Other long term (current) drug therapy: Secondary | ICD-10-CM

## 2016-04-14 LAB — COMPREHENSIVE METABOLIC PANEL
ALK PHOS: 56 U/L (ref 33–130)
ALT: 14 U/L (ref 6–29)
AST: 23 U/L (ref 10–35)
Albumin: 4.4 g/dL (ref 3.6–5.1)
BILIRUBIN TOTAL: 0.6 mg/dL (ref 0.2–1.2)
BUN: 18 mg/dL (ref 7–25)
CALCIUM: 9.6 mg/dL (ref 8.6–10.4)
CO2: 27 mmol/L (ref 20–31)
CREATININE: 0.92 mg/dL (ref 0.50–0.99)
Chloride: 107 mmol/L (ref 98–110)
GLUCOSE: 89 mg/dL (ref 65–99)
Potassium: 4.2 mmol/L (ref 3.5–5.3)
SODIUM: 141 mmol/L (ref 135–146)
Total Protein: 6.8 g/dL (ref 6.1–8.1)

## 2016-04-14 LAB — CBC WITH DIFFERENTIAL/PLATELET
BASOS PCT: 1 %
Basophils Absolute: 34 cells/uL (ref 0–200)
EOS PCT: 3 %
Eosinophils Absolute: 102 cells/uL (ref 15–500)
HEMATOCRIT: 38.9 % (ref 35.0–45.0)
Hemoglobin: 13 g/dL (ref 11.7–15.5)
LYMPHS ABS: 1088 {cells}/uL (ref 850–3900)
LYMPHS PCT: 32 %
MCH: 31.7 pg (ref 27.0–33.0)
MCHC: 33.4 g/dL (ref 32.0–36.0)
MCV: 94.9 fL (ref 80.0–100.0)
MONO ABS: 340 {cells}/uL (ref 200–950)
MPV: 9.9 fL (ref 7.5–12.5)
Monocytes Relative: 10 %
Neutro Abs: 1836 cells/uL (ref 1500–7800)
Neutrophils Relative %: 54 %
PLATELETS: 226 10*3/uL (ref 140–400)
RBC: 4.1 MIL/uL (ref 3.80–5.10)
RDW: 14 % (ref 11.0–15.0)
WBC: 3.4 10*3/uL — AB (ref 4.0–10.5)

## 2016-04-14 LAB — LIPID PANEL
Cholesterol: 227 mg/dL — ABNORMAL HIGH (ref <200)
HDL: 103 mg/dL (ref >50)
LDL CALC: 108 mg/dL — AB (ref <100)
Total CHOL/HDL Ratio: 2.2 Ratio (ref <5.0)
Triglycerides: 81 mg/dL (ref <150)
VLDL: 16 mg/dL (ref <30)

## 2016-04-14 LAB — TSH: TSH: 1.35 m[IU]/L

## 2016-04-14 NOTE — Progress Notes (Signed)
   Subjective:    Patient ID: Diane [name], female    DOB: [birth year], 65 y.o.   MRN: [id number]  HPI He is here for medication check. She continues to quite nicely on Synthroid and is really having no difficulties with that. She has no other major concerns. Review of her record indicates she has not had a shingles vaccine. Also she is overdue for a Pap and will need a colonoscopy. She's had no chest pain, shortness of breath, GI issues.   Review of Systems     Objective:   Physical Exam Alert and in no distress. Tympanic membranes and canals are normal. Pharyngeal area is normal. Neck is supple without adenopathy or thyromegaly. Cardiac exam shows a regular sinus rhythm without murmurs or gallops. Lungs are clear to auscultation. DTRs are normal. Skin is normal.        Assessment & Plan:  Counseling on health promotion and disease prevention - Plan: CBC with Differential/Platelet, Comprehensive metabolic panel, Lipid panel  Hypothyroidism, unspecified type - Plan: TSH  Encounter for long-term (current) use of medications - Plan: CBC with Differential/Platelet, Comprehensive metabolic panel, Lipid panel, TSH Discussed health maintenance with her in regard to immunizations specifically getting shingles as well as pneumonia shots both of which she has declined. I also discussed the need for a Pap smear and she wants to hold off on that as well. She is due for colonoscopy and again is not interested at this point. I discussed the risks and benefits of all this with her. She finds to reconsider this next year. Over 25 minutes, greater than 50% spent in counseling and coordination of care.

## 2016-04-15 MED ORDER — SYNTHROID 125 MCG PO TABS: 125.0000 ug | ORAL_TABLET | Freq: Every day | ORAL | 3 refills | Status: DC

## 2016-04-15 NOTE — Addendum Note (Signed)
Addended by: Denita Lung on: 04/15/2016 01:05 PM   Modules accepted: Orders

## 2016-10-18 ENCOUNTER — Telehealth: Payer: Self-pay

## 2016-10-18 NOTE — Telephone Encounter (Signed)
Let her know that she is covered and will be no danger to the kid

## 2016-10-18 NOTE — Telephone Encounter (Signed)
Pt called to ask if there is any contradiction in her getting another tdap vaccine, if its needed. I see she just had it in 2013. Her daughter is expecting her first grand child. She will like a callback to [phone].

## 2016-10-18 NOTE — Telephone Encounter (Signed)
Pt aware. /RLB  

## 2017-04-07 ENCOUNTER — Other Ambulatory Visit: Payer: Self-pay | Admitting: Family Medicine

## 2017-06-15 ENCOUNTER — Encounter: Payer: Self-pay | Admitting: Family Medicine

## 2017-06-15 ENCOUNTER — Ambulatory Visit (INDEPENDENT_AMBULATORY_CARE_PROVIDER_SITE_OTHER): Payer: Commercial Managed Care - HMO | Admitting: Family Medicine

## 2017-06-15 VITALS — BP 112/76 | HR 57 | Wt 132.8 lb

## 2017-06-15 DIAGNOSIS — Z1211 Encounter for screening for malignant neoplasm of colon: Secondary | ICD-10-CM | POA: Diagnosis not present

## 2017-06-15 DIAGNOSIS — Z23 Encounter for immunization: Secondary | ICD-10-CM | POA: Diagnosis not present

## 2017-06-15 DIAGNOSIS — E039 Hypothyroidism, unspecified: Secondary | ICD-10-CM

## 2017-06-15 DIAGNOSIS — Z7189 Other specified counseling: Secondary | ICD-10-CM | POA: Diagnosis not present

## 2017-06-15 DIAGNOSIS — Z13220 Encounter for screening for lipoid disorders: Secondary | ICD-10-CM | POA: Diagnosis not present

## 2017-06-15 DIAGNOSIS — Z79899 Other long term (current) drug therapy: Secondary | ICD-10-CM

## 2017-06-15 NOTE — Progress Notes (Signed)
   Subjective:    Patient ID: Diane [name], female    DOB: July 24, 1950, 67 y.o.   MRN: [id number]  HPI She is here for a medication check appointment.  She presently is taking Synthroid and having no problems with that.  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  She does not smoke or drink.  She has no particular concerns or complaints.  Immunizations and health maintenance was reviewed  Review of Systems     Objective:   Physical Exam Alert and in no distress. Tympanic membranes and canals are normal. Pharyngeal area is normal. Neck is supple without adenopathy or thyromegaly. Cardiac exam shows a regular sinus rhythm without murmurs or gallops. Lungs are clear to auscultation.        Assessment & Plan:  Need for vaccination against Streptococcus pneumoniae - Plan: Pneumococcal conjugate vaccine 13-valent  Hypothyroidism, unspecified type - Plan: TSH  Encounter for long-term (current) use of medications - Plan: CBC with Differential/Platelet, Comprehensive metabolic panel, Lipid panel  Counseling on health promotion and disease prevention  Screening for lipid disorders - Plan: Lipid panel  Screening for colon cancer - Plan: Cologuard  I encouraged her to continue with her active lifestyle as well as remaining alcohol and drug free.  Health maintenance was reviewed.  She will set up a DEXA scan when she gets her next mammogram.  Discussed getting the Shingrix vaccine with her and she will set that up.  Also recommend she set up for Pap smear sometime within the next several months.

## 2017-06-16 LAB — CBC WITH DIFFERENTIAL/PLATELET
BASOS ABS: 0 10*3/uL (ref 0.0–0.2)
BASOS: 1 %
EOS (ABSOLUTE): 0.1 10*3/uL (ref 0.0–0.4)
Eos: 3 %
HEMOGLOBIN: 13.8 g/dL (ref 11.1–15.9)
Hematocrit: 40.5 % (ref 34.0–46.6)
Immature Grans (Abs): 0 10*3/uL (ref 0.0–0.1)
Immature Granulocytes: 0 %
LYMPHS ABS: 1.4 10*3/uL (ref 0.7–3.1)
Lymphs: 34 %
MCH: 31.9 pg (ref 26.6–33.0)
MCHC: 34.1 g/dL (ref 31.5–35.7)
MCV: 94 fL (ref 79–97)
MONOCYTES: 7 %
Monocytes Absolute: 0.3 10*3/uL (ref 0.1–0.9)
NEUTROS ABS: 2.2 10*3/uL (ref 1.4–7.0)
Neutrophils: 55 %
Platelets: 223 10*3/uL (ref 150–379)
RBC: 4.33 x10E6/uL (ref 3.77–5.28)
RDW: 14.5 % (ref 12.3–15.4)
WBC: 4 10*3/uL (ref 3.4–10.8)

## 2017-06-16 LAB — LIPID PANEL
CHOL/HDL RATIO: 2.3 ratio (ref 0.0–4.4)
Cholesterol, Total: 208 mg/dL — ABNORMAL HIGH (ref 100–199)
HDL: 89 mg/dL (ref >39)
LDL Calculated: 99 mg/dL (ref 0–99)
Triglycerides: 100 mg/dL (ref 0–149)
VLDL Cholesterol Cal: 20 mg/dL (ref 5–40)

## 2017-06-16 LAB — COMPREHENSIVE METABOLIC PANEL
A/G RATIO: 1.8 (ref 1.2–2.2)
ALBUMIN: 4.4 g/dL (ref 3.6–4.8)
ALK PHOS: 72 IU/L (ref 39–117)
ALT: 17 IU/L (ref 0–32)
AST: 23 IU/L (ref 0–40)
BILIRUBIN TOTAL: 0.4 mg/dL (ref 0.0–1.2)
BUN / CREAT RATIO: 16 (ref 12–28)
BUN: 14 mg/dL (ref 8–27)
CHLORIDE: 104 mmol/L (ref 96–106)
CO2: 25 mmol/L (ref 20–29)
Calcium: 9.9 mg/dL (ref 8.7–10.3)
Creatinine, Ser: 0.86 mg/dL (ref 0.57–1.00)
GFR calc non Af Amer: 71 mL/min/{1.73_m2} (ref >59)
GFR, EST AFRICAN AMERICAN: 82 mL/min/{1.73_m2} (ref >59)
GLOBULIN, TOTAL: 2.4 g/dL (ref 1.5–4.5)
GLUCOSE: 90 mg/dL (ref 65–99)
POTASSIUM: 4.7 mmol/L (ref 3.5–5.2)
SODIUM: 144 mmol/L (ref 134–144)
Total Protein: 6.8 g/dL (ref 6.0–8.5)

## 2017-06-16 LAB — TSH: TSH: 0.82 u[IU]/mL (ref 0.450–4.500)

## 2017-06-16 MED ORDER — SYNTHROID 125 MCG PO TABS: 125.0000 ug | ORAL_TABLET | Freq: Every day | ORAL | 3 refills | Status: DC

## 2017-06-16 NOTE — Addendum Note (Signed)
Addended by: Jill Alexanders C on: 06/16/2017 11:00 AM   Modules accepted: Orders

## 2017-07-04 DIAGNOSIS — Z1212 Encounter for screening for malignant neoplasm of rectum: Secondary | ICD-10-CM | POA: Diagnosis not present

## 2017-07-04 DIAGNOSIS — Z1211 Encounter for screening for malignant neoplasm of colon: Secondary | ICD-10-CM | POA: Diagnosis not present

## 2017-07-06 LAB — COLOGUARD: Cologuard: NEGATIVE

## 2017-10-04 DIAGNOSIS — L718 Other rosacea: Secondary | ICD-10-CM | POA: Diagnosis not present

## 2017-11-02 ENCOUNTER — Telehealth: Payer: Self-pay

## 2017-11-02 NOTE — Telephone Encounter (Signed)
Called pt to make an appt for awv . No answer LVM. kh

## 2017-11-23 ENCOUNTER — Telehealth: Payer: Self-pay | Admitting: Family Medicine

## 2017-11-23 NOTE — Telephone Encounter (Signed)
Pt would like to be set up for a mammogram she has a gab in care, states she was here for a medcheck at the beginning at the year and does not need to come in for a cpe and would just like to be set up for the mammogram, informed pt that you was out of the town, pt can be reached at [phone]

## 2017-11-23 NOTE — Telephone Encounter (Signed)
Called pt to let her know that she needs a yearly CPE(has gabs in breast cancer and colon cancer)

## 2017-11-28 NOTE — Telephone Encounter (Signed)
Set this up 

## 2017-12-05 NOTE — Telephone Encounter (Signed)
Pt was aet up with [facility] for her mammo. Pt appt is 12-13-17 at 11:45 am. Called pt no answer lvm. [place]

## 2017-12-15 DIAGNOSIS — M79671 Pain in right foot: Secondary | ICD-10-CM | POA: Diagnosis not present

## 2017-12-22 DIAGNOSIS — M2021 Hallux rigidus, right foot: Secondary | ICD-10-CM | POA: Diagnosis not present

## 2017-12-22 DIAGNOSIS — M79671 Pain in right foot: Secondary | ICD-10-CM | POA: Diagnosis not present

## 2018-06-25 ENCOUNTER — Other Ambulatory Visit: Payer: Self-pay | Admitting: Family Medicine

## 2018-07-10 ENCOUNTER — Telehealth: Payer: Self-pay

## 2018-07-10 NOTE — Telephone Encounter (Signed)
Copied from [place] [id number]. Topic: General - Other >> Jul 10, 2018 10:41 AM Diane [name] wrote: [phone] for CRM: Patient stated she was advised that Dr. Lorelei Pont would accept her as a new patient since her husband Diane [name] is already a patient of Dr. Lorelei Pont. Cb#

## 2018-07-10 NOTE — Telephone Encounter (Signed)
Appt scheduled 08/07/2018.

## 2018-07-10 NOTE — Telephone Encounter (Signed)
Sure, that is ok

## 2018-07-10 NOTE — Telephone Encounter (Signed)
Please advise 

## 2018-07-10 NOTE — Telephone Encounter (Signed)
Please schedule NP appt at her convenience.

## 2018-08-01 ENCOUNTER — Telehealth: Payer: Self-pay

## 2018-08-01 NOTE — Telephone Encounter (Signed)
Appt rescheduled 08/31/2018.

## 2018-08-01 NOTE — Telephone Encounter (Signed)
Copied from [place] [phone]. Topic: Appointment Scheduling - Prior Auth Required for Appointment >> Aug 01, 2018 11:30 AM Margot Ables wrote: No appointment has been scheduled. Patient is requesting new patient appointment with Dr. Lorelei Pont to be rescheduled. Per scheduling protocol, this appointment requires a prior authorization prior to scheduling. Pt insurance will not cover until after 08/16/2018. Please call to RS new pt.   Route to department's PEC pool.

## 2018-08-01 NOTE — Telephone Encounter (Signed)
Needs reschedule.

## 2018-08-01 NOTE — Telephone Encounter (Signed)
Yes ok 

## 2018-08-01 NOTE — Telephone Encounter (Signed)
Please advise 

## 2018-08-07 ENCOUNTER — Ambulatory Visit: Payer: Self-pay | Admitting: Family Medicine

## 2018-08-14 ENCOUNTER — Telehealth: Payer: Self-pay

## 2018-08-14 NOTE — Telephone Encounter (Signed)
lvm for pt to schedule her med well visit either for a later date or virtual visit. [place]

## 2018-08-30 ENCOUNTER — Telehealth: Payer: Self-pay | Admitting: Family Medicine

## 2018-08-30 NOTE — Progress Notes (Signed)
[place] at [facility] [address], [place], [place] [address] [phone]  Date:  08/31/2018   Name:  Diane [name]   DOB:  09-13-1950   MRN:  [id number]  PCP:  Denita Lung, MD    Chief Complaint: No chief complaint on file.   History of Present Illness:  Diane [name] is a 68 y.o. very pleasant female patient who presents with the following:  Virtual visit today due to COVID-19 pandemic Patient location is home Provider location is office Patient identity is confirmed with name and date of birth, she gives consent for virtual visit today  I am seeing Nuri as a new patient today, I also take care of her husband Quillian Quince She reports that he is doing well  She is from the local area, from [place].  They have 3 kids, and 10 grands.    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 She is still able to work some during the pandemic She loves to walk and exercise outdoors She does some gardening as a hobby She generally exercises by walking daily Her business is very physically active as well  She weights 126 and she is 5'8'; she has always been slim   Never had an operation Never really been sick in her life per her report She is a never smoker, does not drink much alcohol   Last labs in 05/2017 Last colon 2007- however she did cologuard last year and it was negative  No bone density done yet  mammo is a few years old- 2016 She would like to go ahead and order those 2 tests  Lab Results  Component Value Date   TSH 0.820 06/15/2017     Patient Active Problem List   Diagnosis Date Noted  . Hypothyroid 12/29/2012    Past Medical History:  Diagnosis Date  . Hypothyroid   . Personal history of colonic polyps     Past Surgical History:  Procedure Laterality Date  . COLONOSCOPY  2007   Dr. Ardis Hughs     Social History   Tobacco Use  . Smoking status: Never Smoker  . Smokeless tobacco: Never Used  Substance Use Topics  . Alcohol use: No  . Drug use: No    Family History  Problem Relation Age of Onset  . Cancer Mother 69       Breast cancer  . Other Mother        polymyositis?  Marland Kitchen COPD Father   . Cancer Brother        Throat cancer    No Known Allergies  Medication list has been reviewed and updated.  Current Outpatient Medications on File Prior to Visit  Medication Sig Dispense Refill  . SYNTHROID 125 MCG tablet TAKE 1 TABLET BY MOUTH EVERY DAY 90 tablet 0   No current facility-administered medications on file prior to visit.     Review of Systems:  As per HPI- otherwise negative. No fever or chills, no cough Elorah notes that her mood has been good despite recent stressors. Physical Examination: There were no vitals filed for this visit. There were no vitals filed for this visit. There is no height or weight on file to calculate BMI. Ideal Body Weight:    Patient seen over camera today.  She appears well, slim build.  No cough, wheezing, tachypnea or distress is apparent   Assessment and Plan: Encounter for medical examination to establish care  Hypothyroidism, unspecified type - Plan: SYNTHROID 125 MCG tablet  Screening for breast cancer - Plan: MM 3D SCREEN BREAST BILATERAL  Estrogen deficiency - Plan: DG Bone Density  New patient visit today to establish care.  Refilled her Synthroid medication, and place orders for bone density scan and mammogram She has no other particular concerns today.  We plan to see her in person as soon as possible, she is overdue for labs.  I have asked her to call in about 1 month and see if we can get her scheduled for an in person visit  She will let me know if any concerns or needs in the meantime  Signed Lamar Blinks, MD

## 2018-08-30 NOTE — Telephone Encounter (Signed)
Spoke with pt about updating insurance card, pt will call during the day to give [employer].

## 2018-08-31 ENCOUNTER — Ambulatory Visit (INDEPENDENT_AMBULATORY_CARE_PROVIDER_SITE_OTHER): Payer: Medicare HMO | Admitting: Family Medicine

## 2018-08-31 ENCOUNTER — Encounter: Payer: Self-pay | Admitting: Family Medicine

## 2018-08-31 ENCOUNTER — Other Ambulatory Visit: Payer: Self-pay

## 2018-08-31 DIAGNOSIS — E2839 Other primary ovarian failure: Secondary | ICD-10-CM | POA: Diagnosis not present

## 2018-08-31 DIAGNOSIS — E039 Hypothyroidism, unspecified: Secondary | ICD-10-CM | POA: Diagnosis not present

## 2018-08-31 DIAGNOSIS — Z0000 Encounter for general adult medical examination without abnormal findings: Secondary | ICD-10-CM

## 2018-08-31 DIAGNOSIS — Z1239 Encounter for other screening for malignant neoplasm of breast: Secondary | ICD-10-CM

## 2018-08-31 MED ORDER — SYNTHROID 125 MCG PO TABS: 125.0000 ug | ORAL_TABLET | Freq: Every day | ORAL | 3 refills | Status: DC

## 2018-10-18 ENCOUNTER — Telehealth: Payer: Self-pay | Admitting: *Deleted

## 2018-10-18 NOTE — Telephone Encounter (Signed)
Copied from [place] [phone]. Topic: General - Inquiry >> Oct 18, 2018 10:10 AM Richardo Priest, NT wrote: Reason for CRM: Patient is in need of a lab order for her blood work for June or July. Call back is [phone].

## 2018-10-24 NOTE — Telephone Encounter (Signed)
Patient scheduled for next Monday to come in for ov.

## 2018-10-28 NOTE — Progress Notes (Addendum)
[place] at [facility] [address], [place], [place] [address] [phone]  Date:  10/30/2018   Name:  Diane [name]   DOB:  1950/06/26   MRN:  [id number]  PCP:  Darreld Mclean, MD    Chief Complaint: Medication Refill (med check, lab work) and Foot Issue (plantar Fascia, right foot)   History of Present Illness:  Diane [name] is a 68 y.o. very pleasant female patient who presents with the following:  History of hypothyroidism.  We did a virtual new patient visit in April. Here today with concern of routine check up  Most recent labs in January of 2019 She is married, has 3 kids and 10 grandchildren- the youngest one just turned 2!    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- she has still been able to work during the pandemic She has noted a problem with her right heel, and the right ankle may swell slightly also- this has gone on for about 6 months now She is wearing a soft insole in her shoe which helps  She thinks that she might have plantar fascitis from doing online reserach Walking barefoot is really painful for her She has more pain when she first gets up in the am, will improve, but then more painful by the evening again The sx will come and go  She is already doing stretches and ice massage   Can finish her pneumonia series today, can suggest shingrix mammo and dexa are scheduled for 7/1 She is fasting his am  Pap: last in 2013, never had an abnormal   Lab Results  Component Value Date   TSH 0.820 06/15/2017    Wt Readings from Last 3 Encounters:  10/30/18 126 lb (57.2 kg)  06/15/17 132 lb 12.8 oz (60.2 kg)  04/14/16 130 lb 12.8 oz (59.3 kg)    Patient Active Problem List   Diagnosis Date Noted  . Hypothyroid 12/29/2012    Past Medical History:  Diagnosis Date  . Hypothyroid   . Personal history of colonic polyps     Past Surgical History:  Procedure Laterality Date  .  COLONOSCOPY  2007   Dr. Ardis Hughs    Social History   Tobacco Use  . Smoking status: Never Smoker  . Smokeless tobacco: Never Used  Substance Use Topics  . Alcohol use: No  . Drug use: No    Family History  Problem Relation Age of Onset  . Cancer Mother 97       Breast cancer  . Other Mother        polymyositis?  Marland Kitchen COPD Father   . Cancer Brother        Throat cancer    No Known Allergies  Medication list has been reviewed and updated.  Current Outpatient Medications on File Prior to Visit  Medication Sig Dispense Refill  . SYNTHROID 125 MCG tablet Take 1 tablet (125 mcg total) by mouth daily. 90 tablet 3   No current facility-administered medications on file prior to visit.     Review of Systems:  As per HPI- otherwise negative. No PMB No fever or chills   Physical Examination: Vitals:   10/30/18 0846  BP: 120/70  Pulse: 70  Resp: 16  Temp: 97.8 F (36.6 C)  SpO2: 96%   Vitals:   10/30/18 0846  Weight: 126 lb (57.2 kg)  Height: 5\' 8"  (1.727 m)   Body  mass index is 19.16 kg/m. Ideal Body Weight: Weight in (lb) to have BMI = 25: 164.1  GEN: WDWN, NAD, Non-toxic, A & O x 3, thin build, looks well  HEENT: Atraumatic, Normocephalic. Neck supple. No masses, No LAD. Ears and Nose: No external deformity. CV: RRR, No M/G/R. No JVD. No thrill. No extra heart sounds. PULM: CTA B, no wheezes, crackles, rhonchi. No retractions. No resp. distress. No accessory muscle use. ABD: S, NT, ND EXTR: No c/c/e NEURO Normal gait.  PSYCH: Normally interactive. Conversant. Not depressed or anxious appearing.  Calm demeanor.  Pelvic: normal, no vaginal lesions or discharge. Uterus normal, no CMT, no adnexal tendereness or masses Right foot- she has mild tenderness of the heel at the attachment of the plantar fascia.  Otherwise normal exam for age   Assessment and Plan: Screening for cervical cancer - Plan: Cytology - PAP  Screening for deficiency anemia - Plan:  CBC  Screening for hyperlipidemia - Plan: Lipid panel  Screening for diabetes mellitus - Plan: Comprehensive metabolic panel, Hemoglobin A1c  Immunization due - Plan: Pneumococcal polysaccharide vaccine 23-valent greater than or equal to 2yo subcutaneous/IM  Hypothyroidism, unspecified type - Plan: TSH  Plantar fasciitis of right foot - Plan: Ambulatory referral to Orthopedic Surgery  Routine visit today Screening as above- Will plan further follow- up pending labs and pap  Referral to ortho for 6 months of persistent PF of the right foot    Signed Lamar Blinks, MD  Received her labs as below, message to pt  Results for orders placed or performed in visit on 10/30/18  CBC  Result Value Ref Range   WBC 4.0 4.0 - 10.5 K/uL   RBC 4.30 3.87 - 5.11 Mil/uL   Platelets 210.0 150.0 - 400.0 K/uL   Hemoglobin 14.2 12.0 - 15.0 g/dL   HCT 42.3 36.0 - 46.0 %   MCV 98.4 78.0 - 100.0 fl   MCHC 33.6 30.0 - 36.0 g/dL   RDW 14.2 11.5 - 15.5 %  Comprehensive metabolic panel  Result Value Ref Range   Sodium 140 135 - 145 mEq/L   Potassium 4.9 3.5 - 5.1 mEq/L   Chloride 104 96 - 112 mEq/L   CO2 28 19 - 32 mEq/L   Glucose, Bld 90 70 - 99 mg/dL   BUN 19 6 - 23 mg/dL   Creatinine, Ser 0.91 0.40 - 1.20 mg/dL   Total Bilirubin 0.6 0.2 - 1.2 mg/dL   Alkaline Phosphatase 59 39 - 117 U/L   AST 25 0 - 37 U/L   ALT 29 0 - 35 U/L   Total Protein 6.8 6.0 - 8.3 g/dL   Albumin 4.6 3.5 - 5.2 g/dL   Calcium 9.7 8.4 - 10.5 mg/dL   GFR 61.75 >60.00 mL/min  Hemoglobin A1c  Result Value Ref Range   Hgb A1c MFr Bld 5.7 4.6 - 6.5 %  Lipid panel  Result Value Ref Range   Cholesterol 195 0 - 200 mg/dL   Triglycerides 119.0 0.0 - 149.0 mg/dL   HDL 68.80 >39.00 mg/dL   VLDL 23.8 0.0 - 40.0 mg/dL   LDL Cholesterol 102 (H) 0 - 99 mg/dL   Total CHOL/HDL Ratio 3    NonHDL 126.02   TSH  Result Value Ref Range   TSH 1.99 0.35 - 4.50 uIU/mL    The 10-year ASCVD risk score Mikey Bussing DC Jr., et al., 2013)  is: 5%   Values used to calculate the score:     Age:  71 years     Sex: Female     Is Non-Hispanic African American: No     Diabetic: No     Tobacco smoker: No     Systolic Blood Pressure: 425 mmHg     Is BP treated: No     HDL Cholesterol: 68.8 mg/dL     Total Cholesterol: 195 mg/dL

## 2018-10-30 ENCOUNTER — Encounter: Payer: Self-pay | Admitting: Family Medicine

## 2018-10-30 ENCOUNTER — Other Ambulatory Visit (HOSPITAL_COMMUNITY)
Admission: RE | Admit: 2018-10-30 | Discharge: 2018-10-30 | Disposition: A | Discharge status: Home / Self Care | Payer: Medicare HMO | Source: Ambulatory Visit | Attending: Family Medicine | Admitting: Family Medicine

## 2018-10-30 ENCOUNTER — Other Ambulatory Visit: Payer: Self-pay

## 2018-10-30 ENCOUNTER — Ambulatory Visit (INDEPENDENT_AMBULATORY_CARE_PROVIDER_SITE_OTHER): Payer: Medicare HMO | Admitting: Family Medicine

## 2018-10-30 VITALS — BP 120/70 | HR 70 | Temp 97.8°F | Resp 16 | Ht 68.0 in | Wt 126.0 lb

## 2018-10-30 DIAGNOSIS — Z131 Encounter for screening for diabetes mellitus: Secondary | ICD-10-CM

## 2018-10-30 DIAGNOSIS — Z1151 Encounter for screening for human papillomavirus (HPV): Secondary | ICD-10-CM | POA: Diagnosis not present

## 2018-10-30 DIAGNOSIS — Z780 Asymptomatic menopausal state: Secondary | ICD-10-CM | POA: Insufficient documentation

## 2018-10-30 DIAGNOSIS — M722 Plantar fascial fibromatosis: Secondary | ICD-10-CM

## 2018-10-30 DIAGNOSIS — E039 Hypothyroidism, unspecified: Secondary | ICD-10-CM | POA: Diagnosis not present

## 2018-10-30 DIAGNOSIS — Z130 Encounter for screening for diseases of the blood and blood-forming organs and certain disorders involving the immune mechanism: Secondary | ICD-10-CM | POA: Diagnosis not present

## 2018-10-30 DIAGNOSIS — Z13220 Encounter for screening for lipoid disorders: Secondary | ICD-10-CM | POA: Diagnosis not present

## 2018-10-30 DIAGNOSIS — Z124 Encounter for screening for malignant neoplasm of cervix: Secondary | ICD-10-CM | POA: Insufficient documentation

## 2018-10-30 DIAGNOSIS — Z23 Encounter for immunization: Secondary | ICD-10-CM | POA: Diagnosis not present

## 2018-10-30 LAB — CBC
HCT: 42.3 % (ref 36.0–46.0)
Hemoglobin: 14.2 g/dL (ref 12.0–15.0)
MCHC: 33.6 g/dL (ref 30.0–36.0)
MCV: 98.4 fl (ref 78.0–100.0)
Platelets: 210 10*3/uL (ref 150.0–400.0)
RBC: 4.3 Mil/uL (ref 3.87–5.11)
RDW: 14.2 % (ref 11.5–15.5)
WBC: 4 10*3/uL (ref 4.0–10.5)

## 2018-10-30 LAB — COMPREHENSIVE METABOLIC PANEL
ALT: 29 U/L (ref 0–35)
AST: 25 U/L (ref 0–37)
Albumin: 4.6 g/dL (ref 3.5–5.2)
Alkaline Phosphatase: 59 U/L (ref 39–117)
BUN: 19 mg/dL (ref 6–23)
CO2: 28 mEq/L (ref 19–32)
Calcium: 9.7 mg/dL (ref 8.4–10.5)
Chloride: 104 mEq/L (ref 96–112)
Creatinine, Ser: 0.91 mg/dL (ref 0.40–1.20)
GFR: 61.75 mL/min (ref >60.00)
Glucose, Bld: 90 mg/dL (ref 70–99)
Potassium: 4.9 mEq/L (ref 3.5–5.1)
Sodium: 140 mEq/L (ref 135–145)
Total Bilirubin: 0.6 mg/dL (ref 0.2–1.2)
Total Protein: 6.8 g/dL (ref 6.0–8.3)

## 2018-10-30 LAB — LIPID PANEL
Cholesterol: 195 mg/dL (ref 0–200)
HDL: 68.8 mg/dL (ref >39.00)
LDL Cholesterol: 102 mg/dL — ABNORMAL HIGH (ref 0–99)
NonHDL: 126.02
Total CHOL/HDL Ratio: 3
Triglycerides: 119 mg/dL (ref 0.0–149.0)
VLDL: 23.8 mg/dL (ref 0.0–40.0)

## 2018-10-30 LAB — TSH: TSH: 1.99 u[IU]/mL (ref 0.35–4.50)

## 2018-10-30 LAB — HEMOGLOBIN A1C: Hgb A1c MFr Bld: 5.7 % (ref 4.6–6.5)

## 2018-10-30 NOTE — Patient Instructions (Addendum)
Good to see you today!  Take care and I will be in touch with your labs and pap asap Assuming your pap is normal you don't have to do this again You got your 2nd pneumonia shot today You might want to have the shingles vaccine series; would be give at your drug store  I am going to refer you to orthopedics to look at your foot.  Certainly continue using your stretches and ice massage for the time being

## 2018-10-31 LAB — CYTOLOGY - PAP
Diagnosis: NEGATIVE
HPV: NOT DETECTED

## 2018-11-13 DIAGNOSIS — M79671 Pain in right foot: Secondary | ICD-10-CM | POA: Diagnosis not present

## 2018-11-13 DIAGNOSIS — M722 Plantar fascial fibromatosis: Secondary | ICD-10-CM | POA: Diagnosis not present

## 2018-11-13 DIAGNOSIS — M2141 Flat foot [pes planus] (acquired), right foot: Secondary | ICD-10-CM | POA: Diagnosis not present

## 2018-11-13 DIAGNOSIS — G8929 Other chronic pain: Secondary | ICD-10-CM | POA: Diagnosis not present

## 2018-11-13 DIAGNOSIS — M7731 Calcaneal spur, right foot: Secondary | ICD-10-CM | POA: Diagnosis not present

## 2018-11-13 DIAGNOSIS — M19071 Primary osteoarthritis, right ankle and foot: Secondary | ICD-10-CM | POA: Diagnosis not present

## 2018-12-01 ENCOUNTER — Ambulatory Visit
Admission: RE | Admit: 2018-12-01 | Discharge: 2018-12-01 | Disposition: A | Discharge status: Home / Self Care | Payer: Medicare HMO | Source: Ambulatory Visit | Attending: Family Medicine | Admitting: Family Medicine

## 2018-12-01 ENCOUNTER — Encounter: Payer: Self-pay | Admitting: Family Medicine

## 2018-12-01 DIAGNOSIS — M8580 Other specified disorders of bone density and structure, unspecified site: Secondary | ICD-10-CM | POA: Insufficient documentation

## 2018-12-01 DIAGNOSIS — Z1231 Encounter for screening mammogram for malignant neoplasm of breast: Secondary | ICD-10-CM | POA: Diagnosis not present

## 2018-12-01 DIAGNOSIS — E2839 Other primary ovarian failure: Secondary | ICD-10-CM

## 2018-12-01 DIAGNOSIS — Z780 Asymptomatic menopausal state: Secondary | ICD-10-CM | POA: Diagnosis not present

## 2018-12-01 DIAGNOSIS — M8589 Other specified disorders of bone density and structure, multiple sites: Secondary | ICD-10-CM | POA: Diagnosis not present

## 2018-12-01 DIAGNOSIS — Z1239 Encounter for other screening for malignant neoplasm of breast: Secondary | ICD-10-CM

## 2019-04-18 ENCOUNTER — Other Ambulatory Visit: Payer: Self-pay

## 2019-04-18 NOTE — Progress Notes (Addendum)
[place] at [facility] [address], [place], [place] [address] [phone] [id number] [phone]  Date:  04/19/2019   Name:  Diane [name]   DOB:  [birth year]   MRN:  [id number]  PCP:  Darreld Mclean, MD    Chief Complaint: Back Pain (pressure in lower back) and Vaginal Discharge (sunday bloody discharge, urinary burning)   History of Present Illness:  Diane [name] is a 68 y.o. very pleasant female patient who presents with the following:  Patient with history of osteopenia and hypothyroidism Here today with concern of vaginal discharge and lower back pain Last seen by myself in June for Pap and labs  Flu vaccine:  Declines  Shingles vaccine:  declines  She has noted some blood in ?her urine and some brownish vaginal discharge She has noted this for about 6 weeks now- will come and go She does not see blood today Her urine is not actually appeared bloody or pink, she just saw some blood on toilet paper after wiping and thought it came from her urine or urethra No blood in her stool  She did have some off and on lower back pressure- nothing that bothered her that much She is doing her usual activities   She noted some urinary urgency at first- generally she has a very strong bladder This was usual for her This has improved No dysuria or gross hematuria  Never had a kidney stone Never a smoker   She has NOT undergone hysterectomy  Menopause age 3-no previous postmenopausal bleeding No history of abnl pap   She would like to recheck A1c today- was minimally high in June She has cut down on sweets and this triggered weight loss she thinks  She has lost about 15 pounds since January 2019, she does attribute this to cutting calories Notes that she has always lost weight easily  We checked her thyroid in June Wt Readings from Last 3 Encounters:  04/19/19 117 lb (53.1 kg)  10/30/18 126 lb (57.2 kg)  06/15/17 132 lb 12.8 oz (60.2 kg)     Lab Results  Component Value Date   HGBA1C 5.6 04/19/2019    Patient Active Problem List   Diagnosis Date Noted  . Osteopenia 12/01/2018  . Hypothyroid 12/29/2012    Past Medical History:  Diagnosis Date  . Hypothyroid   . Personal history of colonic polyps     Past Surgical History:  Procedure Laterality Date  . COLONOSCOPY  2007   Dr. Ardis Hughs    Social History   Tobacco Use  . Smoking status: Never Smoker  . Smokeless tobacco: Never Used  Substance Use Topics  . Alcohol use: No  . Drug use: No    Family History  Problem Relation Age of Onset  . Cancer Mother 31       Breast cancer  . Other Mother        polymyositis?  Marland Kitchen COPD Father   . Cancer Brother        Throat cancer    No Known Allergies  Medication list has been reviewed and updated.  Current Outpatient Medications on File Prior to Visit  Medication Sig Dispense Refill  . SYNTHROID 125 MCG tablet Take 1 tablet (125 mcg total) by mouth daily. 90 tablet 3   No current facility-administered medications on file prior to visit.     Review of Systems:  As per HPI- otherwise negative.   Physical Examination:  Vitals:   04/19/19 0902  BP: 132/80  Pulse: 77  Resp: 16  Temp: (!) 96 F (35.6 C)  SpO2: 97%   Vitals:   04/19/19 0902  Weight: 117 lb (53.1 kg)  Height: 5\' 8"  (1.727 m)   Body mass index is 17.79 kg/m. Ideal Body Weight: Weight in (lb) to have BMI = 25: 164.1  GEN: WDWN, NAD, Non-toxic, A & O x 3, thin, has lost weight HEENT: Atraumatic, Normocephalic. Neck supple. No masses, No LAD. Ears and Nose: No external deformity. CV: RRR, No M/G/R. No JVD. No thrill. No extra heart sounds. PULM: CTA B, no wheezes, crackles, rhonchi. No retractions. No resp. distress. No accessory muscle use. ABD: S, NT, ND, +BS. No rebound. No HSM. EXTR: No c/c/e NEURO Normal gait.  PSYCH: Normally interactive. Conversant. Not depressed or anxious appearing.  Calm demeanor.  Pelvic: normal  external vulva, no vaginal lesions but there is a scant amount of brownish discharge in the vaginal vault-collected on swab. Uterus normal, no CMT, no adnexal tendereness or masses  Performed hemoccult on her vaginal swab- positive for blood   Results for orders placed or performed in visit on 04/19/19  Urine culture   Specimen: Urine  Result Value Ref Range   MICRO NUMBER: [id number]    SPECIMEN QUALITY: Adequate    Sample Source NOT GIVEN    STATUS: FINAL    Result: No Growth   CBC  Result Value Ref Range   WBC 4.0 4.0 - 10.5 K/uL   RBC 4.07 3.87 - 5.11 Mil/uL   Platelets 201.0 150.0 - 400.0 K/uL   Hemoglobin 13.7 12.0 - 15.0 g/dL   HCT 41.0 36.0 - 46.0 %   MCV 100.8 (H) 78.0 - 100.0 fl   MCHC 33.4 30.0 - 36.0 g/dL   RDW 14.4 11.5 - 15.5 %  Comprehensive metabolic panel  Result Value Ref Range   Sodium 142 135 - 145 mEq/L   Potassium 4.0 3.5 - 5.1 mEq/L   Chloride 104 96 - 112 mEq/L   CO2 29 19 - 32 mEq/L   Glucose, Bld 91 70 - 99 mg/dL   BUN 18 6 - 23 mg/dL   Creatinine, Ser 0.95 0.40 - 1.20 mg/dL   Total Bilirubin 0.7 0.2 - 1.2 mg/dL   Alkaline Phosphatase 57 39 - 117 U/L   AST 26 0 - 37 U/L   ALT 33 0 - 35 U/L   Total Protein 6.7 6.0 - 8.3 g/dL   Albumin 4.6 3.5 - 5.2 g/dL   GFR 58.68 (L) >60.00 mL/min   Calcium 9.6 8.4 - 10.5 mg/dL  Hemoglobin A1c  Result Value Ref Range   Hgb A1c MFr Bld 5.6 4.6 - 6.5 %  TSH  Result Value Ref Range   TSH 3.93 0.35 - 4.50 uIU/mL  POCT urinalysis dipstick  Result Value Ref Range   Color, UA yellow yellow   Clarity, UA clear clear   Glucose, UA negative negative mg/dL   Bilirubin, UA negative negative   Ketones, POC UA negative negative mg/dL   Spec Grav, UA 1.020 1.010 - 1.025   Blood, UA negative negative   pH, UA 6.0 5.0 - 8.0   Protein Ur, POC negative negative mg/dL   Urobilinogen, UA 0.2 0.2 or 1.0 E.U./dL   Nitrite, UA Negative Negative   Leukocytes, UA Negative Negative    Assessment and Plan: Post-menopausal  bleeding - Plan: Ambulatory referral to Obstetrics / Gynecology  Urinary frequency - Plan:  Urine culture, POCT urinalysis dipstick  Weight loss - Plan: CBC, Comprehensive metabolic panel  Elevated hemoglobin A1c - Plan: Comprehensive metabolic panel, Hemoglobin A1c  Hypothyroidism, unspecified type - Plan: TSH  Here today with concern of possible blood in her urine.  However her urine dip is negative, and vaginal exam reveals that she is most likely having uterine bleeding.  Explained that postmenopausal bleeding always needs thorough evaluation, as it can indicate endometrial cancer.  I discussed this with her in detail, answered all questions today.  We are referring her to GYN as soon as possible Her A1c was 5.7% earlier this year.  She has cut calories, reduce sweets, and lost too much weight.  We will recheck her A1c today, I asked her to work on regaining some of the weight she has lost by replacing the calories she was getting from sweets with other healthy choices  I also asked the lab to add on TSH if possible  Will plan further follow- up pending labs.  This visit occurred during the SARS-CoV-2 public health emergency.  Safety protocols were in place, including screening questions prior to the visit, additional usage of staff PPE, and extensive cleaning of exam room while observing appropriate contact time as indicated for disinfecting solutions.    Signed Lamar Blinks, MD   Received her labs, message to pt  Results for orders placed or performed in visit on 04/19/19  Urine culture   Specimen: Urine  Result Value Ref Range   MICRO NUMBER: [id number]    SPECIMEN QUALITY: Adequate    Sample Source NOT GIVEN    STATUS: FINAL    Result: No Growth   CBC  Result Value Ref Range   WBC 4.0 4.0 - 10.5 K/uL   RBC 4.07 3.87 - 5.11 Mil/uL   Platelets 201.0 150.0 - 400.0 K/uL   Hemoglobin 13.7 12.0 - 15.0 g/dL   HCT 41.0 36.0 - 46.0 %   MCV 100.8 (H) 78.0 - 100.0 fl   MCHC  33.4 30.0 - 36.0 g/dL   RDW 14.4 11.5 - 15.5 %  Comprehensive metabolic panel  Result Value Ref Range   Sodium 142 135 - 145 mEq/L   Potassium 4.0 3.5 - 5.1 mEq/L   Chloride 104 96 - 112 mEq/L   CO2 29 19 - 32 mEq/L   Glucose, Bld 91 70 - 99 mg/dL   BUN 18 6 - 23 mg/dL   Creatinine, Ser 0.95 0.40 - 1.20 mg/dL   Total Bilirubin 0.7 0.2 - 1.2 mg/dL   Alkaline Phosphatase 57 39 - 117 U/L   AST 26 0 - 37 U/L   ALT 33 0 - 35 U/L   Total Protein 6.7 6.0 - 8.3 g/dL   Albumin 4.6 3.5 - 5.2 g/dL   GFR 58.68 (L) >60.00 mL/min   Calcium 9.6 8.4 - 10.5 mg/dL  Hemoglobin A1c  Result Value Ref Range   Hgb A1c MFr Bld 5.6 4.6 - 6.5 %  TSH  Result Value Ref Range   TSH 3.93 0.35 - 4.50 uIU/mL  POCT urinalysis dipstick  Result Value Ref Range   Color, UA yellow yellow   Clarity, UA clear clear   Glucose, UA negative negative mg/dL   Bilirubin, UA negative negative   Ketones, POC UA negative negative mg/dL   Spec Grav, UA 1.020 1.010 - 1.025   Blood, UA negative negative   pH, UA 6.0 5.0 - 8.0   Protein Ur, POC negative negative mg/dL  Urobilinogen, UA 0.2 0.2 or 1.0 E.U./dL   Nitrite, UA Negative Negative   Leukocytes, UA Negative Negative   Blood counts look fine, minimal elevation of MCV is okay but could indicate a B12 or folate deficiency;I would suggest an over-the-counter B complex supplement Metabolic profile looks okay.  Your GFR-kidney filtration rate-is minimally low at this time.  We will monitor this  Your A1c-average blood sugar-is in normal range. Thyroid normal  Please let me know if you do not hear from GYN in the next week or so, please work on gaining back some of the weight you have lost!    Let me know if I can do anything to help, otherwise please see me in about 6 months  Addendum 12/5, received her urine culture-negative Message sent to patient

## 2019-04-18 NOTE — Progress Notes (Deleted)
[place] at [facility] [address] [place], [place] [address] [phone]  Date:  04/19/2019   Name:  Diane [name]   DOB:  1950/10/27   MRN:  [id number]  PCP:  Darreld Mclean, MD    Chief Complaint: No chief complaint on file.   History of Present Illness:  Diane [name] is a 68 y.o. very pleasant female patient who presents with the following:  Patient with history of osteopenia and hypothyroidism Last seen by myself in June  She has a [place], married with 3 children and 10 grandchildren Pap done in June, negative  Lab Results  Component Value Date   TSH 1.99 10/30/2018   Flu vaccine  Patient Active Problem List   Diagnosis Date Noted  . Osteopenia 12/01/2018  . Hypothyroid 12/29/2012    Past Medical History:  Diagnosis Date  . Hypothyroid   . Personal history of colonic polyps     Past Surgical History:  Procedure Laterality Date  . COLONOSCOPY  2007   Dr. Ardis Hughs    Social History   Tobacco Use  . Smoking status: Never Smoker  . Smokeless tobacco: Never Used  Substance Use Topics  . Alcohol use: No  . Drug use: No    Family History  Problem Relation Age of Onset  . Cancer Mother 52       Breast cancer  . Other Mother        polymyositis?  Marland Kitchen COPD Father   . Cancer Brother        Throat cancer    No Known Allergies  Medication list has been reviewed and updated.  Current Outpatient Medications on File Prior to Visit  Medication Sig Dispense Refill  . SYNTHROID 125 MCG tablet Take 1 tablet (125 mcg total) by mouth daily. 90 tablet 3   No current facility-administered medications on file prior to visit.     Review of Systems:  As per HPI- otherwise negative.   Physical Examination: There were no vitals filed for this visit. There were no vitals filed for this visit. There is no height or weight on file to calculate BMI. Ideal Body Weight:    GEN: WDWN,  NAD, Non-toxic, A & O x 3 HEENT: Atraumatic, Normocephalic. Neck supple. No masses, No LAD. Ears and Nose: No external deformity. CV: RRR, No M/G/R. No JVD. No thrill. No extra heart sounds. PULM: CTA B, no wheezes, crackles, rhonchi. No retractions. No resp. distress. No accessory muscle use. ABD: S, NT, ND, +BS. No rebound. No HSM. EXTR: No c/c/e NEURO Normal gait.  PSYCH: Normally interactive. Conversant. Not depressed or anxious appearing.  Calm demeanor.    Assessment and Plan: *** This visit occurred during the SARS-CoV-2 public health emergency.  Safety protocols were in place, including screening questions prior to the visit, additional usage of staff PPE, and extensive cleaning of exam room while observing appropriate contact time as indicated for disinfecting solutions.  ' Signed Lamar Blinks, MD

## 2019-04-19 ENCOUNTER — Ambulatory Visit: Payer: Medicare HMO | Admitting: Family Medicine

## 2019-04-19 ENCOUNTER — Ambulatory Visit (INDEPENDENT_AMBULATORY_CARE_PROVIDER_SITE_OTHER): Payer: Medicare HMO | Admitting: Family Medicine

## 2019-04-19 ENCOUNTER — Encounter: Payer: Self-pay | Admitting: Family Medicine

## 2019-04-19 ENCOUNTER — Other Ambulatory Visit: Payer: Self-pay

## 2019-04-19 VITALS — BP 132/80 | HR 77 | Temp 96.0°F | Resp 16 | Ht 68.0 in | Wt 117.0 lb

## 2019-04-19 DIAGNOSIS — N950 Postmenopausal bleeding: Secondary | ICD-10-CM

## 2019-04-19 DIAGNOSIS — R634 Abnormal weight loss: Secondary | ICD-10-CM | POA: Diagnosis not present

## 2019-04-19 DIAGNOSIS — R7309 Other abnormal glucose: Secondary | ICD-10-CM

## 2019-04-19 DIAGNOSIS — E039 Hypothyroidism, unspecified: Secondary | ICD-10-CM | POA: Diagnosis not present

## 2019-04-19 DIAGNOSIS — R350 Frequency of micturition: Secondary | ICD-10-CM | POA: Diagnosis not present

## 2019-04-19 LAB — POCT URINALYSIS DIP (MANUAL ENTRY)
Bilirubin, UA: NEGATIVE
Blood, UA: NEGATIVE
Glucose, UA: NEGATIVE mg/dL
Ketones, POC UA: NEGATIVE mg/dL
Leukocytes, UA: NEGATIVE
Nitrite, UA: NEGATIVE
Protein Ur, POC: NEGATIVE mg/dL
Spec Grav, UA: 1.02 (ref 1.010–1.025)
Urobilinogen, UA: 0.2 E.U./dL
pH, UA: 6 (ref 5.0–8.0)

## 2019-04-19 LAB — CBC
HCT: 41 % (ref 36.0–46.0)
Hemoglobin: 13.7 g/dL (ref 12.0–15.0)
MCHC: 33.4 g/dL (ref 30.0–36.0)
MCV: 100.8 fl — ABNORMAL HIGH (ref 78.0–100.0)
Platelets: 201 10*3/uL (ref 150.0–400.0)
RBC: 4.07 Mil/uL (ref 3.87–5.11)
RDW: 14.4 % (ref 11.5–15.5)
WBC: 4 10*3/uL (ref 4.0–10.5)

## 2019-04-19 LAB — COMPREHENSIVE METABOLIC PANEL
ALT: 33 U/L (ref 0–35)
AST: 26 U/L (ref 0–37)
Albumin: 4.6 g/dL (ref 3.5–5.2)
Alkaline Phosphatase: 57 U/L (ref 39–117)
BUN: 18 mg/dL (ref 6–23)
CO2: 29 mEq/L (ref 19–32)
Calcium: 9.6 mg/dL (ref 8.4–10.5)
Chloride: 104 mEq/L (ref 96–112)
Creatinine, Ser: 0.95 mg/dL (ref 0.40–1.20)
GFR: 58.68 mL/min — ABNORMAL LOW (ref >60.00)
Glucose, Bld: 91 mg/dL (ref 70–99)
Potassium: 4 mEq/L (ref 3.5–5.1)
Sodium: 142 mEq/L (ref 135–145)
Total Bilirubin: 0.7 mg/dL (ref 0.2–1.2)
Total Protein: 6.7 g/dL (ref 6.0–8.3)

## 2019-04-19 LAB — HEMOGLOBIN A1C: Hgb A1c MFr Bld: 5.6 % (ref 4.6–6.5)

## 2019-04-19 LAB — TSH: TSH: 3.93 u[IU]/mL (ref 0.35–4.50)

## 2019-04-19 NOTE — Addendum Note (Signed)
Addended by: Kelle Darting A on: 04/19/2019 10:26 AM   Modules accepted: Orders

## 2019-04-19 NOTE — Patient Instructions (Addendum)
Good to see you today- I will be in touch with your labs asap  We are going to get you in with OB-GYN asap.  As we discussed, any bleeding after menopause always needs to be evaluated fully due to the risk of cancer of the uterus (endometrial cancer) We will get you seen as soon as we can!  Please keep me posted and let me know if any questions or concerns You have lost too much weight!  Please try to gain some back

## 2019-04-20 LAB — URINE CULTURE
MICRO NUMBER:: 1160063
Result:: NO GROWTH
SPECIMEN QUALITY:: ADEQUATE

## 2019-05-02 ENCOUNTER — Ambulatory Visit: Payer: Medicare HMO | Admitting: Family Medicine

## 2019-05-16 DIAGNOSIS — Z20828 Contact with and (suspected) exposure to other viral communicable diseases: Secondary | ICD-10-CM | POA: Diagnosis not present

## 2019-06-06 ENCOUNTER — Ambulatory Visit (HOSPITAL_BASED_OUTPATIENT_CLINIC_OR_DEPARTMENT_OTHER): Payer: Medicare HMO

## 2019-06-06 ENCOUNTER — Ambulatory Visit (HOSPITAL_BASED_OUTPATIENT_CLINIC_OR_DEPARTMENT_OTHER)
Admission: RE | Admit: 2019-06-06 | Discharge: 2019-06-06 | Disposition: A | Discharge status: Home / Self Care | Payer: Medicare HMO | Source: Ambulatory Visit | Attending: Obstetrics & Gynecology | Admitting: Obstetrics & Gynecology

## 2019-06-06 ENCOUNTER — Other Ambulatory Visit: Payer: Self-pay

## 2019-06-06 ENCOUNTER — Ambulatory Visit (INDEPENDENT_AMBULATORY_CARE_PROVIDER_SITE_OTHER): Payer: Medicare HMO | Admitting: Obstetrics & Gynecology

## 2019-06-06 ENCOUNTER — Other Ambulatory Visit (HOSPITAL_COMMUNITY)
Admission: RE | Admit: 2019-06-06 | Discharge: 2019-06-06 | Disposition: A | Discharge status: Home / Self Care | Payer: Medicare HMO | Source: Ambulatory Visit | Attending: Obstetrics & Gynecology | Admitting: Obstetrics & Gynecology

## 2019-06-06 ENCOUNTER — Encounter: Payer: Self-pay | Admitting: Obstetrics & Gynecology

## 2019-06-06 VITALS — BP 115/76 | HR 68 | Ht 68.0 in | Wt 115.0 lb

## 2019-06-06 DIAGNOSIS — C541 Malignant neoplasm of endometrium: Secondary | ICD-10-CM

## 2019-06-06 DIAGNOSIS — N950 Postmenopausal bleeding: Secondary | ICD-10-CM

## 2019-06-06 NOTE — Progress Notes (Signed)
Pt states she has been having post menopausal bleeding x 2 months.Diane [name] l Diane [name], CMA

## 2019-06-06 NOTE — Progress Notes (Signed)
Subjective:     Diane [name] is a 69 y.o. female here for a routine exam. G3P3003 LMP Age 84. Current complaints: post menopausal bleeding since Nov 2020.  Pt reports some weight loss that she attributes to stopping all dietary sugar. She denies other sx. She has a 'cyst' on her abd that she reports has been present for ~ 1 year.  She reports being healthy with no chronic medical problems and no prev surgeries. She lives with her husband and has 3 adult children who live in close proximity to her. cd ev   Gynecologic History No LMP recorded. Patient is postmenopausal. Contraception: post menopausal status Last Pap: 10/30/2018. Results were: normal Last mammogram: 12/01/2018. Results were: normal  Obstetric History OB History  Gravida Para Term Preterm AB Living  3 3 3     3   SAB TAB Ectopic Multiple Live Births          3    # Outcome Date GA Lbr Len/2nd Weight Sex Delivery Anes PTL Lv  3 Term 1984 [redacted]w[redacted]d   F Vag-Spont None N LIV  2 Term 64 [redacted]w[redacted]d   M Vag-Spont None N LIV  1 Term 69 [redacted]w[redacted]d   M Vag-Spont None N LIV     The following portions of the patient's history were reviewed and updated as appropriate: allergies, current medications, past family history, past medical history, past social history, past surgical history and problem list.  Review of Systems Pertinent items are noted in HPI.    Objective:   BP 115/76   Pulse 68   Ht 5\' 8"  (1.727 m)   Wt 115 lb (52.2 kg)   BMI 17.49 kg/m   CONSTITUTIONAL: Well-developed, well-nourished female in no acute distress.  HENT:  Normocephalic, atraumatic EYES: Conjunctivae and EOM are normal. No scleral icterus.  NECK: Normal range of motion SKIN: Skin is warm and dry. No rash noted. Not diaphoretic.No pallor. [place]: Alert and oriented to person, place, and time. Normal coordination.  Abd: firm nodule that is mobile and appears superficial in the LUQ. ~1.5 cm in size.  GU: EGBUS: no lesions Vagina: no blood in  vault Cervix: no lesion; no mucopurulent d/c Uterus: small, mobile Adnexa: no masses; non tender   The indications for endometrial biopsy were reviewed.   Risks of the biopsy including cramping, bleeding, infection, uterine perforation, inadequate specimen and need for additional procedures  were discussed. The patient states she understands and agrees to undergo procedure today. Consent was signed. Time out was performed. Urine HCG was negative. A sterile speculum was placed in the patient's vagina and the cervix was prepped with Betadine. A single-toothed tenaculum was placed on the anterior lip of the cervix to stabilize it. The 3 mm pipelle was introduced into the endometrial cavity without difficulty to a depth of 6cm, and a large amount of tissue was obtained and sent to pathology. The instruments were removed from the patient's vagina. Minimal bleeding from the cervix was noted. The patient tolerated the procedure well.   Assessment:     Post menopausal bleeding- given the amount of tisuse, I am concerned about her risk endometrial cancer risk. I have explained this to her and discused that we will confirm this with th eur path. Will get an [place] today. If the bx shows a malignancy. I have explained to her that we will get her referred to a GYN ONC.    Plan:   F/u in 1 week for surg path I have  explained to her that the results will come to her in Epic at the same time they are released to me. She would like to wait to f/u for her results.  F/u surgical pathology Pelvic US  Diane [name], M.D., Cherlynn June

## 2019-06-06 NOTE — Patient Instructions (Signed)
Postmenopausal Bleeding  Postmenopausal bleeding is any bleeding that a woman has after she has entered into menopause. Menopause is the end of a woman's fertile years. After menopause, a woman no longer ovulates and does not have menstrual periods. Postmenopausal bleeding may have various causes, including:  Menopausal hormone therapy (MHT).  Endometrial atrophy. After menopause, low estrogen hormone levels cause the membrane that lines the uterus (endometrium) to become thinner. You may have bleeding as the endometrium thins.  Endometrial hyperplasia. This condition is caused by excess estrogen hormones and low levels of progesterone hormones. The excess estrogen causes the endometrium to thicken, which can lead to bleeding. In some cases, this can lead to cancer of the uterus.  Endometrial cancer.  Non-cancerous growths (polyps) on the endometrium, the lining of the uterus, or the cervix.  Uterine fibroids. These are non-cancerous growths in or around the uterus muscle tissue that can cause heavy bleeding. Any type of postmenopausal bleeding, even if it appears to be a typical menstrual period, should be evaluated by your health care provider. Treatment will depend on the cause of the bleeding. Follow these instructions at home:  Pay attention to any changes in your symptoms.  Avoid using tampons and douches as told by your health care provider.  Change your pads regularly.  Get regular pelvic exams and Pap tests.  Take iron supplements as told by your health care provider.  Take over-the-counter and prescription medicines only as told by your health care provider.  Keep all follow-up visits as told by your health care provider. This is important. Contact a health care provider if:  Your bleeding lasts more than 1 week.  You have abdominal pain.  You have bleeding with or after sexual intercourse.  You have bleeding that happens more often than every 3 weeks. Get help  right away if:  You have a fever, chills, headache, dizziness, muscle aches, and bleeding.  You have severe pain with bleeding.  You are passing blood clots.  You have heavy bleeding, need more than 1 pad an hour, and have never experienced this before.  You feel faint. Summary  Postmenopausal bleeding is any bleeding that a woman has after she has entered into menopause.  Postmenopausal bleeding may have various causes. Treatment will depend on the cause of the bleeding.  Any type of postmenopausal bleeding, even if it appears to be a typical menstrual period, should be evaluated by your health care provider.  Be sure to pay attention to any changes in your symptoms and keep all follow-up visits as told by your health care provider. This information is not intended to replace advice given to you by your health care provider. Make sure you discuss any questions you have with your health care provider. Document Revised: 08/10/2017 Document Reviewed: 07/27/2016 Elsevier Patient Education  [place]. Endometrial Biopsy, Care After This sheet gives you information about how to care for yourself after your procedure. Your health care provider may also give you more specific instructions. If you have problems or questions, contact your health care provider. What can I expect after the procedure? After the procedure, it is common to have:  Mild cramping.  A small amount of vaginal bleeding for a few days. This is normal. Follow these instructions at home:   Take over-the-counter and prescription medicines only as told by your health care provider.  Do not douche, use tampons, or have sexual intercourse until your health care provider approves.  Return to your normal activities  as told by your health care provider. Ask your health care provider what activities are safe for you.  Follow instructions from your health care provider about any activity restrictions, such as  restrictions on strenuous exercise or heavy lifting. Contact a health care provider if:  You have heavy bleeding, or bleed for longer than 2 days after the procedure.  You have bad smelling discharge from your vagina.  You have a fever or chills.  You have a burning sensation when urinating or you have difficulty urinating.  You have severe pain in your lower abdomen. Get help right away if:  You have severe cramps in your stomach or back.  You pass large blood clots.  Your bleeding increases.  You become weak or light-headed, or you pass out. Summary  After the procedure, it is common to have mild cramping and a small amount of vaginal bleeding for a few days.  Do not douche, use tampons, or have sexual intercourse until your health care provider approves.  Return to your normal activities as told by your health care provider. Ask your health care provider what activities are safe for you. This information is not intended to replace advice given to you by your health care provider. Make sure you discuss any questions you have with your health care provider. Document Revised: 04/15/2017 Document Reviewed: 05/19/2016 Elsevier Patient Education  2020 [place]. Endometrial Biopsy  Endometrial biopsy is a procedure in which a tissue sample is taken from inside the uterus. The sample is taken from the endometrium, which is the lining of the uterus. The tissue sample is then checked under a microscope to see if the tissue is normal or abnormal. This procedure helps to determine where you are in your menstrual cycle and how hormone levels are affecting the lining of the uterus. This procedure may also be used to evaluate uterine bleeding or to diagnose endometrial cancer, endometrial tuberculosis, polyps, or other inflammatory conditions. Tell a health care provider about:  Any allergies you have.  All medicines you are taking, including vitamins, herbs, eye drops, creams, and  over-the-counter medicines.  Any problems you or family members have had with anesthetic medicines.  Any blood disorders you have.  Any surgeries you have had.  Any medical conditions you have.  Whether you are pregnant or may be pregnant. What are the risks? Generally, this is a safe procedure. However, problems may occur, including:  Bleeding.  Pelvic infection.  Puncture of the wall of the uterus with the biopsy device (rare). What happens before the procedure?  Keep a record of your menstrual cycles as told by your health care provider. You may need to schedule your procedure for a specific time in your cycle.  You may want to bring a sanitary pad to wear after the procedure.  Ask your health care provider about: ? Changing or stopping your regular medicines. This is especially important if you are taking diabetes medicines or blood thinners. ? Taking medicines such as aspirin and ibuprofen. These medicines can thin your blood. Do not take these medicines before your procedure if your health care provider instructs you not to.  Plan to have someone take you home from the hospital or clinic. What happens during the procedure?  To lower your risk of infection: ? Your health care team will wash or sanitize their hands.  You will lie on an exam table with your feet and legs supported as in a pelvic exam.  Your health care provider  will insert an instrument (speculum) into your vagina to see your cervix.  Your cervix will be cleansed with an antiseptic solution.  A medicine (local anesthetic) will be used to numb the cervix.  A forceps instrument (tenaculum) will be used to hold your cervix steady for the biopsy.  A thin, rod-like instrument (uterine sound) will be inserted through your cervix to determine the length of your uterus and the location where the biopsy sample will be removed.  A thin, flexible tube (catheter) will be inserted through your cervix and into the  uterus. The catheter will be used to collect the biopsy sample from your endometrial tissue.  The catheter and speculum will then be removed, and the tissue sample will be sent to a lab for examination. What happens after the procedure?  You will rest in a recovery area until you are ready to go home.  You may have mild cramping and a small amount of vaginal bleeding. This is normal.  It is up to you to get the results of your procedure. Ask your health care provider, or the department that is doing the procedure, when your results will be ready. Summary  Endometrial biopsy is a procedure in which a tissue sample is taken from the endometrium, which is the lining of the uterus.  This procedure may help to diagnose menstrual cycle problems, abnormal bleeding, or other conditions affecting the endometrium.  Before the procedure, keep a record of your menstrual cycles as told by your health care provider.  The tissue sample that is removed will be checked under a microscope to see if it is normal or abnormal. This information is not intended to replace advice given to you by your health care provider. Make sure you discuss any questions you have with your health care provider. Document Revised: 04/15/2017 Document Reviewed: 05/19/2016 Elsevier Patient Education  [place].

## 2019-06-07 ENCOUNTER — Telehealth: Payer: Self-pay

## 2019-06-07 NOTE — Telephone Encounter (Signed)
Diane [name] from [facility] called to give report for US done on 06/06/19.  Joseh Sjogren l Terrye Dombrosky, CMA

## 2019-06-08 LAB — SURGICAL PATHOLOGY

## 2019-06-11 ENCOUNTER — Ambulatory Visit (INDEPENDENT_AMBULATORY_CARE_PROVIDER_SITE_OTHER): Payer: Medicare HMO | Admitting: Obstetrics & Gynecology

## 2019-06-11 ENCOUNTER — Other Ambulatory Visit: Payer: Self-pay

## 2019-06-11 ENCOUNTER — Encounter: Payer: Self-pay | Admitting: Obstetrics & Gynecology

## 2019-06-11 VITALS — BP 128/77 | HR 82 | Ht 68.0 in | Wt 115.0 lb

## 2019-06-11 DIAGNOSIS — C541 Malignant neoplasm of endometrium: Secondary | ICD-10-CM

## 2019-06-11 NOTE — Patient Instructions (Signed)
Uterine Cancer  Uterine cancer is an abnormal growth of cancer tissue (malignant tumor) in the uterus. Unlike noncancerous (benign) tumors, malignant tumors can spread to other parts of the body. Uterine cancer usually occurs after menopause. However, it may also occur around the time that menopause begins. The wall of the uterus has an inner layer of tissue (endometrium) and an outer layer of muscle tissue (myometrium). The most common type of uterine cancer begins in the endometrium (endometrial cancer). Cancer that begins in the myometrium (uterine sarcoma) is very rare. What are the causes? The exact cause of this condition is not known. What increases the risk? You are more likely to develop this condition if you:  Are older than 50.  Have an enlarged endometrium (endometrial hyperplasia).  Use hormone therapy.  Are severely overweight (obese).  Use the medicine tamoxifen.  You are white (Caucasian).  Cannot bear children (are infertile).  Have never been pregnant.  Started menstruating at an age younger than 12 years.  Are older than 52 and are still having menstrual periods.  Have a history of cancer of the ovaries, intestines, or colon or rectum (colorectal cancer).  Have a history of enlarged ovaries with small cysts (polycystic ovarian syndrome).  Have a family history of: ? Uterine cancer. ? Hereditary nonpolyposis colon cancer (HNPCC).  Have diabetes, high blood pressure, thyroid disease, or gallbladder disease.  Use long-term, high-dose birth control pills.  Have been exposed to radiation.  Smoke. What are the signs or symptoms? Symptoms of this condition include:  Abnormal vaginal bleeding or discharge. Bleeding may start as a watery, blood-streaked flow that gradually contains more blood. This is the most common symptom. If you experience abnormal vaginal bleeding, do not assume that it is part of menopause.  Vaginal bleeding after  menopause.  Unexplained weight loss.  Bleeding between periods.  Urination that is difficult, painful, or more frequent than usual.  A lump (mass) in the vagina.  Pain, bloating, or fullness in the abdomen.  Pain in the pelvic area.  Pain during sex. How is this diagnosed? This condition may be diagnosed based on:  Your medical history and your symptoms.  A physical and pelvic exam. Your health care provider will feel your pelvis for any growths or enlarged lymph nodes.  Blood and urine tests.  Imaging tests, such as X-rays, CT scans, ultrasound, or MRIs.  A procedure in which a thin, flexible tube with a light and camera on the end is inserted through the vagina and used to look inside the uterus (hysteroscopy).  A Pap test to check for abnormal cells in the lower part of the uterus (cervix) and the upper vagina.  Removing a tissue sample (biopsy) from the uterine lining to check for cancer cells.  Dilation and curettage (D&C). This is a procedure that involves stretching (dilation) the cervix and scraping (curettage) the inside lining of the uterus to get a biopsy and check for cancer cells. Your cancer will be staged to determine its severity and extent. Staging is an assessment of:  The size of the tumor.  Whether the cancer has spread.  Where the cancer has spread. The stages of uterine cancer are as follows:  Stage I. The cancer is only found in the uterus.  Stage II. The cancer has spread to the cervix.  Stage III. The cancer has spread outside the uterus, but not outside the pelvis. The cancer may have spread to the lymph nodes in the pelvis. Lymph nodes are   part of your body's disease-fighting (immune) system. Lymph nodes are found in many locations in your body, including the neck, underarm, and groin.  Stage IV. The cancer has spread to other parts of the body, such as the bladder or rectum. How is this treated? This condition is often treated with surgery  to remove:  The uterus, cervix, fallopian tubes, and ovaries (total hysterectomy).  The uterus and cervix (simple hysterectomy). The type of hysterectomy you will have depends on the extent of your cancer. Lymph nodes near the uterus may also be removed in some cases. Treatment may also include one or more of the following:  Chemotherapy. This uses medicines to kill the cancer cells and prevent their spread.  Radiation therapy. This uses high-energy rays to kill the cancer cells and prevent the spread of cancer.  Chemoradiation. This is a combination treatment that alternates chemotherapy with radiation treatments to enhance the way radiation works.  Brachytherapy. This involves placing radioactive materials inside the body where the cancer was removed.  Hormone therapy. This includes taking medicines that lower the levels of estrogen in the body. Follow these instructions at home: Activity  Return to your normal activities as told by your health care provider. Ask your health care provider what activities are safe for you.  Exercise regularly as told by your health care provider.  Do not drive or use heavy machinery while taking prescription pain medicine. General instructions  Take over-the-counter and prescription medicines only as told by your health care provider.  Maintain a healthy diet.  Work with your health care provider to: ? Manage any long-term (chronic) conditions you have, such as diabetes, high blood pressure, thyroid disease, or gallbladder disease. ? Manage any side effects of your treatment.  Do not use any products that contain nicotine or tobacco, such as cigarettes and e-cigarettes. If you need help quitting, ask your health care provider.  Consider joining a support group to help you cope with stress. Your health care provider may be able to recommend a local or online support group.  Keep all follow-up visits as told by your health care provider. This is  important. Where to find more information  American Cancer Society: https://www.cancer.org  National Cancer Institute (NCI): https://www.cancer.gov Contact a health care provider if:  You have pain in your pelvis or abdomen that gets worse.  You cannot urinate.  You have abnormal bleeding.  You have a fever. Get help right away if:  You develop sudden or new severe symptoms, such as: ? Heavy bleeding. ? Severe weakness. ? Pain that is severe or does not get better with medicine. Summary  Uterine cancer is an abnormal growth of cancer tissue (malignant tumor) in the uterus. The most common type of uterine cancer begins in the endometrium (endometrial cancer).  This condition is often treated with surgery to remove the uterus, cervix, fallopian tubes, and ovaries (total hysterectomy) or the uterus and cervix (simple hysterectomy).  Work with your health care provider to manage any long-term (chronic) conditions you have, such as diabetes, high blood pressure, thyroid disease, or gallbladder disease.  Consider joining a support group to help you cope with stress. Your health care provider may be able to recommend a local or online support group. This information is not intended to replace advice given to you by your health care provider. Make sure you discuss any questions you have with your health care provider. Document Revised: 04/15/2017 Document Reviewed: 04/30/2016 Elsevier Patient Education  2020 [employer]  

## 2019-06-11 NOTE — Progress Notes (Signed)
History:  69 y.o. G3P3003 here today for follow up of surgical pathology and [place] results.   The following portions of the patient's history were reviewed and updated as appropriate: allergies, current medications, past family history, past medical history, past social history, past surgical history and problem list.  Review of Systems:  Pertinent items are noted in HPI.    Objective:  Physical Exam Blood pressure 128/77, pulse 82, height 5\' 8"  (1.727 m), weight 115 lb (52.2 kg).  CONSTITUTIONAL: Well-developed, well-nourished female in no acute distress.  HENT:  Normocephalic, atraumatic EYES: Conjunctivae and EOM are normal. No scleral icterus.  NECK: Normal range of motion SKIN: Skin is warm and dry. No rash noted. Not diaphoretic.No pallor. [place]: Alert and oriented to person, place, and time. Normal coordination.    Labs and Imaging US PELVIS TRANSVAGINAL NON-OB (TV ONLY)  Result Date: 06/06/2019 CLINICAL DATA:  Postmenopausal bleeding for 2 months EXAM: TRANSABDOMINAL AND TRANSVAGINAL ULTRASOUND OF PELVIS TECHNIQUE: Both transabdominal and transvaginal ultrasound examinations of the pelvis were performed. Transabdominal technique was performed for global imaging of the pelvis including uterus, ovaries, adnexal regions, and pelvic cul-de-sac. It was necessary to proceed with endovaginal exam following the transabdominal exam to visualize the endometrium and ovaries. COMPARISON:  None FINDINGS: Uterus Measurements: 6.1 x 3.7 x 5.1 cm = volume: 59 mL. Anteverted. No focal mass. Endometrium Thickness: 22 mm. Abnormally thickened and heterogeneous demonstrating internal blood flow on color Doppler imaging Right ovary Measurements: 2.3 x 1.1 x 1.1 cm = volume: 1.4 mL. Normal morphology without mass Left ovary Not definitely visualized, likely obscured by bowel Other findings No free pelvic fluid or adnexal masses. IMPRESSION: Nonvisualization of LEFT ovary. Abnormal thickened heterogeneous  endometrial complex with internal blood flow on color Doppler imaging. In the setting of post-menopausal bleeding, endometrial sampling is indicated to exclude carcinoma. If results are benign, sonohysterogram should be considered for focal lesion work-up. (Ref: Radiological Reasoning: Algorithmic Workup of Abnormal Vaginal Bleeding with Endovaginal Sonography and Sonohysterography. AJR [id number]) These results will be called to the ordering clinician or representative by the Radiologist Assistant, and communication documented in the PACS or zVision Dashboard. Electronically Signed   By: Lavonia Dana M.D.   On: 06/06/2019 19:23   US PELVIS (TRANSABDOMINAL ONLY)  Result Date: 06/06/2019 CLINICAL DATA:  Postmenopausal bleeding for 2 months EXAM: TRANSABDOMINAL AND TRANSVAGINAL ULTRASOUND OF PELVIS TECHNIQUE: Both transabdominal and transvaginal ultrasound examinations of the pelvis were performed. Transabdominal technique was performed for global imaging of the pelvis including uterus, ovaries, adnexal regions, and pelvic cul-de-sac. It was necessary to proceed with endovaginal exam following the transabdominal exam to visualize the endometrium and ovaries. COMPARISON:  None FINDINGS: Uterus Measurements: 6.1 x 3.7 x 5.1 cm = volume: 59 mL. Anteverted. No focal mass. Endometrium Thickness: 22 mm. Abnormally thickened and heterogeneous demonstrating internal blood flow on color Doppler imaging Right ovary Measurements: 2.3 x 1.1 x 1.1 cm = volume: 1.4 mL. Normal morphology without mass Left ovary Not definitely visualized, likely obscured by bowel Other findings No free pelvic fluid or adnexal masses. IMPRESSION: Nonvisualization of LEFT ovary. Abnormal thickened heterogeneous endometrial complex with internal blood flow on color Doppler imaging. In the setting of post-menopausal bleeding, endometrial sampling is indicated to exclude carcinoma. If results are benign, sonohysterogram should be considered for  focal lesion work-up. (Ref: Radiological Reasoning: Algorithmic Workup of Abnormal Vaginal Bleeding with Endovaginal Sonography and Sonohysterography. [id number] [id number]) These results will be called to the ordering clinician or representative by  the Radiologist Assistant, and communication documented in the PACS or zVision Dashboard. Electronically Signed   By: Lavonia Dana M.D.   On: 06/06/2019 19:23   surg path 06/06/2019: ENDOMETRIUM, BIOPSY:  - High-grade carcinoma  - See comment   COMMENT:   The biopsy material consists of a solid proliferation of neoplastic  cells with focal areas showing gland-like formation and papillary-like  architecture. The differential diagnosis includes endometrioid  carcinoma and serous carcinoma.    Assessment & Plan:  PMPB with thickened endometrium and biopsy showed endometrial ca   I have reviewed with pt the results of the bx. I have discussed with her the initial steps which included referral to GYN ONC and surgery which will include staging. I have shared with her that this is a high grade lesion and may require further radiation or chemo. Pt had questions about the dx and treatment. All of her questions were answered and she is comfortable proceeding with seeing GYN ONC. I explained to pt my concern about the lesion on her left abd wall. She admits that she is concerned as well. We will order a Abd/pelvic CT for further eval.   BMP today Referral to GYN/ONC Abd/Pelvic CT  Total face-to-face time with patient was 20 min.  Greater than 50% was spent in counseling and coordination of care with the patient.   Demi Trieu L. Harraway-Smith, M.D., [facility]lynn June

## 2019-06-12 ENCOUNTER — Telehealth: Payer: Self-pay | Admitting: *Deleted

## 2019-06-12 LAB — CREATININE, SERUM
Creatinine, Ser: 0.91 mg/dL (ref 0.57–1.00)
GFR calc Af Amer: 76 mL/min/{1.73_m2} (ref >59)
GFR calc non Af Amer: 65 mL/min/{1.73_m2} (ref >59)

## 2019-06-12 LAB — BUN: BUN: 16 mg/dL (ref 8–27)

## 2019-06-12 NOTE — Telephone Encounter (Signed)
Called and spoke with the patient, gave the appt for this Thursday 1/28 at 10:15 am. Gave the patient the address and phone number for the clinic. Also gave the policy for masks, visitor and parking. Explained that she will have an internal exam

## 2019-06-14 ENCOUNTER — Ambulatory Visit (HOSPITAL_BASED_OUTPATIENT_CLINIC_OR_DEPARTMENT_OTHER)
Admission: RE | Admit: 2019-06-14 | Discharge: 2019-06-14 | Disposition: A | Discharge status: Home / Self Care | Payer: Medicare HMO | Source: Ambulatory Visit | Attending: Obstetrics & Gynecology | Admitting: Obstetrics & Gynecology

## 2019-06-14 ENCOUNTER — Other Ambulatory Visit: Payer: Self-pay | Admitting: Gynecologic Oncology

## 2019-06-14 ENCOUNTER — Encounter: Payer: Self-pay | Admitting: Gynecologic Oncology

## 2019-06-14 ENCOUNTER — Encounter (HOSPITAL_BASED_OUTPATIENT_CLINIC_OR_DEPARTMENT_OTHER): Payer: Self-pay

## 2019-06-14 ENCOUNTER — Other Ambulatory Visit: Payer: Self-pay

## 2019-06-14 ENCOUNTER — Inpatient Hospital Stay: Payer: Medicare HMO | Attending: Gynecologic Oncology | Admitting: Gynecologic Oncology

## 2019-06-14 VITALS — BP 122/80 | HR 68 | Temp 97.0°F | Resp 18 | Ht 68.0 in | Wt 114.0 lb

## 2019-06-14 DIAGNOSIS — C541 Malignant neoplasm of endometrium: Secondary | ICD-10-CM | POA: Diagnosis not present

## 2019-06-14 DIAGNOSIS — Z8542 Personal history of malignant neoplasm of other parts of uterus: Secondary | ICD-10-CM | POA: Diagnosis not present

## 2019-06-14 MED ORDER — SENNOSIDES-DOCUSATE SODIUM 8.6-50 MG PO TABS: 2.0000 | ORAL_TABLET | Freq: Every day | ORAL | 1 refills | Status: DC

## 2019-06-14 MED ORDER — IOHEXOL 300 MG/ML  SOLN
100.0000 mL | Freq: Once | INTRAMUSCULAR | Status: AC | PRN
  Administered 2019-06-14: 100 mL via INTRAVENOUS

## 2019-06-14 MED ORDER — IBUPROFEN 800 MG PO TABS: 800.0000 mg | ORAL_TABLET | Freq: Three times a day (TID) | ORAL | 0 refills | Status: DC | PRN

## 2019-06-14 MED ORDER — TRAMADOL HCL 50 MG PO TABS: 50.0000 mg | ORAL_TABLET | Freq: Four times a day (QID) | ORAL | 0 refills | Status: DC | PRN

## 2019-06-14 NOTE — H&P (View-Only) (Signed)
Consult Note: Gyn-Onc  Consult was requested by Dr. Ihor Dow for the evaluation of Diane [name] 69 y.o. female  CC:  Chief Complaint  Patient presents with  . Endometrial carcinoma [facility])    Assessment/Plan:  Diane [name]  is a 69 y.o.  year old with grade 3/serous endometrial cancer.  A detailed discussion was held with the patient and her family with regard to to her endometrial cancer diagnosis. We discussed the standard management options for uterine cancer which includes surgery followed possibly by adjuvant therapy depending on the results of surgery. The options for surgical management include a hysterectomy and removal of the tubes and ovaries possibly with removal of pelvic and para-aortic lymph nodes.If feasible, a minimally invasive approach including a robotic hysterectomy or laparoscopic hysterectomy have benefits including shorter hospital stay, recovery time and better wound healing than with open surgery. The patient has been counseled about these surgical options and the risks of surgery in general including infection, bleeding, damage to surrounding structures (including bowel, bladder, ureters, nerves or vessels), and the postoperative risks of PE/ DVT, and lymphedema. I extensively reviewed the additional risks of robotic hysterectomy including possible need for conversion to open laparotomy.  I discussed positioning during surgery of trendelenberg and risks of minor facial swelling and care we take in preoperative positioning.  After counseling and consideration of her options, she desires to proceed with robotic assisted total hysterectomy with bilateral sapingo-oophorectomy and SLN biopsy.   She will be seen by anesthesia for preoperative clearance and discussion of postoperative pain management.  She was given the opportunity to ask questions, which were answered to her satisfaction, and she is agreement with the above mentioned plan of care.  HPI: Diane [name]  is a 69 year old P3 who was seen in consultation at the request of Dr. Ihor Dow for evaluation of grade 3 endometrial cancer.  The patient reported that she began experiencing postmenopausal bleeding in late November 2020.  She was seen by her primary care physician who acknowledged that this was uterine bleeding and referred her to her gynecologist.  She underwent a transvaginal ultrasound scan on June 06, 2019 which revealed a uterus measuring 6.1 x 3.7 x 5.1 cm with a 22 mm endometrial thickness.  Right and left ovaries were normal.  She then underwent a transvaginal endometrial Pipelle biopsy on June 14, 2019.  This revealed high-grade endometrial adenocarcinoma with a differential diagnosis including endometrioid or serous carcinoma.  Pap test was normal with negative high-risk HPV performed on October 30, 2018.  The patient underwent a CT scan of the abdomen and pelvis with IV contrast on June 14, 2019 which revealed a heterogeneous 2.4 cm mass in the fundus of the uterus but no evidence of metastatic disease.  There was some aortic atherosclerosis but no additional findings that were significant.  The patient is a remarkably healthy woman.  She had no history of diabetes, hypertension, hormone replacement therapy use.  She has had 3 prior vaginal deliveries and no prior surgeries.  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  She lives with her husband.  Her family history is remarkable for mother with breast cancer in her late 46s treated with surgery alone, and a brother who had throat cancer.   Current Meds:  Outpatient Encounter Medications as of 06/14/2019  Medication Sig  . b complex vitamins capsule Take 1 capsule by mouth daily.  . calcium-vitamin D (OSCAL WITH  D) 500-200 MG-UNIT tablet Take 1 tablet by mouth.  . Multiple Vitamin (MULTIVITAMIN) capsule Take 1 capsule by mouth daily.  Marland Kitchen SYNTHROID 125 MCG tablet Take 1  tablet (125 mcg total) by mouth daily.   No facility-administered encounter medications on file as of 06/14/2019.    Allergy: No Known Allergies  Social Hx:   Social History   Socioeconomic History  . Marital status: Married    Spouse name: Not on file  . Number of children: Not on file  . Years of education: Not on file  . Highest education level: Not on file  Occupational History  . Not on file  Tobacco Use  . Smoking status: Never Smoker  . Smokeless tobacco: Never Used  Substance and Sexual Activity  . Alcohol use: No  . Drug use: No  . Sexual activity: Yes    Birth control/protection: None  Other Topics Concern  . Not on file  Social History Narrative  . Not on file   Social Determinants of Health   Financial Resource Strain:   . Difficulty of Paying Living Expenses: Not on file  Food Insecurity:   . Worried About Charity fundraiser in the Last Year: Not on file  . Ran Out of Food in the Last Year: Not on file  Transportation Needs:   . Lack of Transportation (Medical): Not on file  . Lack of Transportation (Non-Medical): Not on file  Physical Activity:   . Days of Exercise per Week: Not on file  . Minutes of Exercise per Session: Not on file  Stress:   . Feeling of Stress : Not on file  Social Connections:   . Frequency of Communication with Friends and Family: Not on file  . Frequency of Social Gatherings with Friends and Family: Not on file  . Attends Religious Services: Not on file  . Active Member of Clubs or Organizations: Not on file  . Attends Archivist Meetings: Not on file  . Marital Status: Not on file  Intimate Partner Violence:   . Fear of Current or Ex-Partner: Not on file  . Emotionally Abused: Not on file  . Physically Abused: Not on file  . Sexually Abused: Not on file    Past Surgical Hx:  Past Surgical History:  Procedure Laterality Date  . COLONOSCOPY  2007   Dr. Ardis Hughs    Past Medical Hx:  Past Medical History:   Diagnosis Date  . Hypothyroid   . Personal history of colonic polyps     Past Gynecological History:  See HPI No LMP recorded. Patient is postmenopausal.  Family Hx:  Family History  Problem Relation Age of Onset  . Cancer Mother 51       Breast cancer  . Other Mother        polymyositis?  Marland Kitchen COPD Father   . Cancer Brother        Throat cancer  . Colon cancer Neg Hx   . Endometrial cancer Neg Hx   . Ovarian cancer Neg Hx     Review of Systems:  Constitutional  Feels well,    ENT Normal appearing ears and nares bilaterally Skin/Breast  No rash, sores, jaundice, itching, dryness Cardiovascular  No chest pain, shortness of breath, or edema  Pulmonary  No cough or wheeze.  Gastro Intestinal  No nausea, vomitting, or diarrhoea. No bright red blood per rectum, no abdominal pain, change in bowel movement, or constipation.  Genito Urinary  No frequency, urgency,  dysuria, + postmenopausal bleeding Musculo Skeletal  No myalgia, arthralgia, joint swelling or pain  Neurologic  No weakness, numbness, change in gait,  Psychology  No depression, anxiety, insomnia.   Vitals:  Blood pressure 122/80, pulse 68, temperature (!) 97 F (36.1 C), temperature source Temporal, resp. rate 18, height 5\' 8"  (1.727 m), weight 114 lb (51.7 kg), SpO2 100 %.  Physical Exam: WD in NAD Neck  Supple NROM, without any enlargements.  Lymph Node Survey No cervical supraclavicular or inguinal adenopathy Cardiovascular  Pulse normal rate, regularity and rhythm. S1 and S2 normal.  Lungs  Clear to auscultation bilateraly, without wheezes/crackles/rhonchi. Good air movement.  Skin  No rash/lesions/breakdown  Psychiatry  Alert and oriented to person, place, and time  Abdomen  Normoactive bowel sounds, abdomen soft, non-tender and thin without evidence of hernia.  Back No CVA tenderness Genito Urinary  Vulva/vagina: Normal external female genitalia.  No lesions. No discharge or  bleeding.  Bladder/urethra:  No lesions or masses, well supported bladder  Vagina: normal  Cervix: Normal appearing, no lesions.  Uterus:  Small, mobile, no parametrial involvement or nodularity.  Adnexa: no palpable masses. Rectal  deferred.  Extremities  No bilateral cyanosis, clubbing or edema.   Thereasa Solo, MD  06/14/2019, 10:40 AM

## 2019-06-14 NOTE — Progress Notes (Signed)
Consult Note: Gyn-Onc  Consult was requested by Dr. Ihor Dow for the evaluation of Diane [name] 69 y.o. female  CC:  Chief Complaint  Patient presents with  . Endometrial carcinoma [name])    Assessment/Plan:  [name]  is a 69 y.o.  year old with grade 3/serous endometrial cancer.  A detailed discussion was held with the patient and her family with regard to to her endometrial cancer diagnosis. We discussed the standard management options for uterine cancer which includes surgery followed possibly by adjuvant therapy depending on the results of surgery. The options for surgical management include a hysterectomy and removal of the tubes and ovaries possibly with removal of pelvic and para-aortic lymph nodes.If feasible, a minimally invasive approach including a robotic hysterectomy or laparoscopic hysterectomy have benefits including shorter hospital stay, recovery time and better wound healing than with open surgery. The patient has been counseled about these surgical options and the risks of surgery in general including infection, bleeding, damage to surrounding structures (including bowel, bladder, ureters, nerves or vessels), and the postoperative risks of PE/ DVT, and lymphedema. I extensively reviewed the additional risks of robotic hysterectomy including possible need for conversion to open laparotomy.  I discussed positioning during surgery of trendelenberg and risks of minor facial swelling and care we take in preoperative positioning.  After counseling and consideration of her options, she desires to proceed with robotic assisted total hysterectomy with bilateral sapingo-oophorectomy and SLN biopsy.   She will be seen by anesthesia for preoperative clearance and discussion of postoperative pain management.  She was given the opportunity to ask questions, which were answered to her satisfaction, and she is agreement with the above mentioned plan of care.  HPI: [name]  is a 69 year old P3 who was seen in consultation at the request of Dr. Ihor Dow for evaluation of grade 3 endometrial cancer.  The patient reported that she began experiencing postmenopausal bleeding in late November 2020.  She was seen by her primary care physician who acknowledged that this was uterine bleeding and referred her to her gynecologist.  She underwent a transvaginal ultrasound scan on June 06, 2019 which revealed a uterus measuring 6.1 x 3.7 x 5.1 cm with a 22 mm endometrial thickness.  Right and left ovaries were normal.  She then underwent a transvaginal endometrial Pipelle biopsy on June 14, 2019.  This revealed high-grade endometrial adenocarcinoma with a differential diagnosis including endometrioid or serous carcinoma.  Pap test was normal with negative high-risk HPV performed on October 30, 2018.  The patient underwent a CT scan of the abdomen and pelvis with IV contrast on June 14, 2019 which revealed a heterogeneous 2.4 cm mass in the fundus of the uterus but no evidence of metastatic disease.  There was some aortic atherosclerosis but no additional findings that were significant.  The patient is a remarkably healthy woman.  She had no history of diabetes, hypertension, hormone replacement therapy use.  She has had 3 prior vaginal deliveries and no prior surgeries.  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  She lives with her husband.  Her family history is remarkable for mother with breast cancer in her late 24s treated with surgery alone, and a brother who had throat cancer.   Current Meds:  Outpatient Encounter Medications as of 06/14/2019  Medication Sig  . b complex vitamins capsule Take 1 capsule by mouth daily.  . calcium-vitamin D (OSCAL WITH  D) 500-200 MG-UNIT tablet Take 1 tablet by mouth.  . Multiple Vitamin (MULTIVITAMIN) capsule Take 1 capsule by mouth daily.  Marland Kitchen SYNTHROID 125 MCG tablet Take 1  tablet (125 mcg total) by mouth daily.   No facility-administered encounter medications on file as of 06/14/2019.    Allergy: No Known Allergies  Social Hx:   Social History   Socioeconomic History  . Marital status: Married    Spouse name: Not on file  . Number of children: Not on file  . Years of education: Not on file  . Highest education level: Not on file  Occupational History  . Not on file  Tobacco Use  . Smoking status: Never Smoker  . Smokeless tobacco: Never Used  Substance and Sexual Activity  . Alcohol use: No  . Drug use: No  . Sexual activity: Yes    Birth control/protection: None  Other Topics Concern  . Not on file  Social History Narrative  . Not on file   Social Determinants of Health   Financial Resource Strain:   . Difficulty of Paying Living Expenses: Not on file  Food Insecurity:   . Worried About Charity fundraiser in the Last Year: Not on file  . Ran Out of Food in the Last Year: Not on file  Transportation Needs:   . Lack of Transportation (Medical): Not on file  . Lack of Transportation (Non-Medical): Not on file  Physical Activity:   . Days of Exercise per Week: Not on file  . Minutes of Exercise per Session: Not on file  Stress:   . Feeling of Stress : Not on file  Social Connections:   . Frequency of Communication with Friends and Family: Not on file  . Frequency of Social Gatherings with Friends and Family: Not on file  . Attends Religious Services: Not on file  . Active Member of Clubs or Organizations: Not on file  . Attends Archivist Meetings: Not on file  . Marital Status: Not on file  Intimate Partner Violence:   . Fear of Current or Ex-Partner: Not on file  . Emotionally Abused: Not on file  . Physically Abused: Not on file  . Sexually Abused: Not on file    Past Surgical Hx:  Past Surgical History:  Procedure Laterality Date  . COLONOSCOPY  2007   Dr. Ardis Hughs    Past Medical Hx:  Past Medical History:   Diagnosis Date  . Hypothyroid   . Personal history of colonic polyps     Past Gynecological History:  See HPI No LMP recorded. Patient is postmenopausal.  Family Hx:  Family History  Problem Relation Age of Onset  . Cancer Mother 77       Breast cancer  . Other Mother        polymyositis?  Marland Kitchen COPD Father   . Cancer Brother        Throat cancer  . Colon cancer Neg Hx   . Endometrial cancer Neg Hx   . Ovarian cancer Neg Hx     Review of Systems:  Constitutional  Feels well,    ENT Normal appearing ears and nares bilaterally Skin/Breast  No rash, sores, jaundice, itching, dryness Cardiovascular  No chest pain, shortness of breath, or edema  Pulmonary  No cough or wheeze.  Gastro Intestinal  No nausea, vomitting, or diarrhoea. No bright red blood per rectum, no abdominal pain, change in bowel movement, or constipation.  Genito Urinary  No frequency, urgency,  dysuria, + postmenopausal bleeding Musculo Skeletal  No myalgia, arthralgia, joint swelling or pain  Neurologic  No weakness, numbness, change in gait,  Psychology  No depression, anxiety, insomnia.   Vitals:  Blood pressure 122/80, pulse 68, temperature (!) 97 F (36.1 C), temperature source Temporal, resp. rate 18, height 5\' 8"  (1.727 m), weight 114 lb (51.7 kg), SpO2 100 %.  Physical Exam: WD in NAD Neck  Supple NROM, without any enlargements.  Lymph Node Survey No cervical supraclavicular or inguinal adenopathy Cardiovascular  Pulse normal rate, regularity and rhythm. S1 and S2 normal.  Lungs  Clear to auscultation bilateraly, without wheezes/crackles/rhonchi. Good air movement.  Skin  No rash/lesions/breakdown  Psychiatry  Alert and oriented to person, place, and time  Abdomen  Normoactive bowel sounds, abdomen soft, non-tender and thin without evidence of hernia.  Back No CVA tenderness Genito Urinary  Vulva/vagina: Normal external female genitalia.  No lesions. No discharge or  bleeding.  Bladder/urethra:  No lesions or masses, well supported bladder  Vagina: normal  Cervix: Normal appearing, no lesions.  Uterus:  Small, mobile, no parametrial involvement or nodularity.  Adnexa: no palpable masses. Rectal  deferred.  Extremities  No bilateral cyanosis, clubbing or edema.   Thereasa Solo, MD  06/14/2019, 10:40 AM

## 2019-06-14 NOTE — Patient Instructions (Signed)
Preparing for your Surgery  Plan for surgery on June 21, 2019 with Dr. Everitt Amber at [place] will be scheduled for a robotic assisted total laparoscopic hysterectomy, bilateral salpingo-oophorectomy, sentinel lymph node biopsy, possible lymph node dissection.   Pre-operative Testing -You will receive a phone call from presurgical testing at [facility] closer to the surgery date to discuss instructions over the phone.   -Bring your insurance card, copy of an advanced directive if applicable, medication list  -At that visit, you will be asked to sign a consent for a possible blood transfusion in case a transfusion becomes necessary during surgery.  The need for a blood transfusion is rare but having consent is a necessary part of your care.     -You should not be taking blood thinners or aspirin at least ten days prior to surgery unless instructed by your surgeon.  -Do not take supplements such as fish oil (omega 3), red yeast rice, tumeric before your surgery.   Day Before Surgery at [place] will be asked to take in a light diet the day before surgery.  Avoid carbonated beverages.  You will be advised to have nothing to eat or drink after midnight the evening before.    Eat a light diet the day before surgery.  Examples including soups, broths, toast, yogurt, mashed potatoes.  Things to avoid include carbonated beverages (fizzy beverages), raw fruits and raw vegetables, or beans.   If your bowels are filled with gas, your surgeon will have difficulty visualizing your pelvic organs which increases your surgical risks.  Your role in recovery Your role is to become active as soon as directed by your doctor, while still giving yourself time to heal.  Rest when you feel tired. You will be asked to do the following in order to speed your recovery:  - Cough and breathe deeply. This helps to clear and expand your lungs and can prevent pneumonia after surgery.  - [place]. Do mild physical activity. Walking or moving your legs help your circulation and body functions return to normal. Do not try to get up or walk alone the first time after surgery.   -If you develop swelling on one leg or the other, pain in the back of your leg, redness/warmth in one of your legs, please call the office or go to the Emergency Room to have a doppler to rule out a blood clot. For shortness of breath, chest pain-seek care in the Emergency Room as soon as possible. - Actively manage your pain. Managing your pain lets you move in comfort. We will ask you to rate your pain on a scale of zero to 10. It is your responsibility to tell your doctor or nurse where and how much you hurt so your pain can be treated.  Special Considerations -If you are diabetic, you may be placed on insulin after surgery to have closer control over your blood sugars to promote healing and recovery.  This does not mean that you will be discharged on insulin.  If applicable, your oral antidiabetics will be resumed when you are tolerating a solid diet.  -Your final pathology results from surgery should be available around one week after surgery and the results will be relayed to you when available.  -Dr. Lahoma Crocker is the surgeon that assists your GYN Oncologist with surgery.  If you end up staying the night, the next day after your surgery you will either  see Dr. Denman George, Dr. Berline Lopes, or Dr. Lahoma Crocker.  -FMLA forms can be faxed to [phone] and please allow 5-7 business days for completion.  Pain Management After Surgery -You have been prescribed your pain medication and bowel regimen medications before surgery so that you can have these available when you are discharged from the hospital. The pain medication is for use ONLY AFTER surgery and a new prescription will not be given.   -Make sure that you have Tylenol and Ibuprofen at home to use on a regular basis after surgery  for pain control. We recommend alternating the medications every hour to six hours since they work differently and are processed in the body differently for pain relief.  -Review the attached handout on narcotic use and their risks and side effects.   Bowel Regimen -You have been prescribed Sennakot-S to take nightly to prevent constipation especially if you are taking the narcotic pain medication intermittently.  It is important to prevent constipation and drink adequate amounts of liquids. You can stop taking this medication when you are not taking pain medication and you are back on your normal bowel routine.   Blood Transfusion Information (For the consent to be signed before surgery)  We will be checking your blood type before surgery so in case of emergencies, we will know what type of blood you would need.                                            WHAT IS A BLOOD TRANSFUSION?  A transfusion is the replacement of blood or some of its parts. Blood is made up of multiple cells which provide different functions.  Red blood cells carry oxygen and are used for blood loss replacement.  White blood cells fight against infection.  Platelets control bleeding.  Plasma helps clot blood.  Other blood products are available for specialized needs, such as hemophilia or other clotting disorders. BEFORE THE TRANSFUSION  Who gives blood for transfusions?   You may be able to donate blood to be used at a later date on yourself (autologous donation).  Relatives can be asked to donate blood. This is generally not any safer than if you have received blood from a stranger. The same precautions are taken to ensure safety when a relative's blood is donated.  Healthy volunteers who are fully evaluated to make sure their blood is safe. This is blood bank blood. Transfusion therapy is the safest it has ever been in the practice of medicine. Before blood is taken from a donor, a complete history is taken  to make sure that person has no history of diseases nor engages in risky social behavior (examples are intravenous drug use or sexual activity with multiple partners). The donor's travel history is screened to minimize risk of transmitting infections, such as malaria. The donated blood is tested for signs of infectious diseases, such as HIV and hepatitis. The blood is then tested to be sure it is compatible with you in order to minimize the chance of a transfusion reaction. If you or a relative donates blood, this is often done in anticipation of surgery and is not appropriate for emergency situations. It takes many days to process the donated blood. RISKS AND COMPLICATIONS Although transfusion therapy is very safe and saves many lives, the main dangers of transfusion include:   Getting an infectious disease.  Developing a transfusion reaction. This is an allergic reaction to something in the blood you were given. Every precaution is taken to prevent this. The decision to have a blood transfusion has been considered carefully by your caregiver before blood is given. Blood is not given unless the benefits outweigh the risks.  AFTER SURGERY INSTRUCTIONS  Return to work: 4-6 weeks if applicable  Activity: 1. Be up and out of the bed during the day.  Take a nap if needed.  You may walk up steps but be careful and use the hand rail.  Stair climbing will tire you more than you think, you may need to stop part way and rest.   2. No lifting or straining for 6 weeks over 10 pounds. No pushing, pulling, straining for 6 weeks.  3. No driving for 1 week(s).  Do not drive if you are taking narcotic pain medicine.   4. You can shower as soon as the next day after surgery. Shower daily.  Use soap and water on your incision and pat dry; don't rub.  No tub baths or submerging your body in water until cleared by your surgeon. If you have the soap that was given to you by pre-surgical testing that was used before  surgery, you do not need to use it afterwards because this can irritate your incisions.   5. No sexual activity and nothing in the vagina for 8 weeks.  6. You may experience a small amount of clear drainage from your incisions, which is normal.  If the drainage persists, increases, or changes color please call the office.  7. Do not use creams, lotions, or ointments such as neosporin on your incisions after surgery until advised by your surgeon because they can cause removal of the dermabond glue on your incisions.    8. You may experience vaginal spotting after surgery or around the 6-8 week mark from surgery when the stitches at the top of the vagina begin to dissolve.  The spotting is normal but if you experience heavy bleeding, call our office.  9. Take Tylenol or ibuprofen first for pain and only use narcotic pain medication for severe pain not relieved by the Tylenol or Ibuprofen.  Monitor your Tylenol intake to a max of 4,000 mg.  Diet: 1. Low sodium Heart Healthy Diet is recommended.  2. It is safe to use a laxative, such as Miralax or Colace, if you have difficulty moving your bowels. You can take Sennakot at bedtime every evening to keep bowel movements regular and to prevent constipation.    Wound Care: 1. Keep clean and dry.  Shower daily.  Reasons to call the Doctor:  Fever - Oral temperature greater than 100.4 degrees Fahrenheit  Foul-smelling vaginal discharge  Difficulty urinating  Nausea and vomiting  Increased pain at the site of the incision that is unrelieved with pain medicine.  Difficulty breathing with or without chest pain  New calf pain especially if only on one side  Sudden, continuing increased vaginal bleeding with or without clots.   Contacts: For questions or concerns you should contact:  Dr. Everitt Amber at [phone]  Joylene John, NP at [phone]  After Hours: call [phone] and have the GYN Oncologist paged/contacted

## 2019-06-15 ENCOUNTER — Encounter (HOSPITAL_COMMUNITY): Payer: Self-pay

## 2019-06-15 NOTE — Patient Instructions (Signed)
DUE TO COVID-19 ONLY ONE VISITOR IS ALLOWED TO COME WITH YOU AND STAY IN THE WAITING ROOM ONLY DURING PRE OP AND PROCEDURE. THE ONE VISITOR MAY VISIT WITH YOU IN YOUR PRIVATE ROOM DURING VISITING HOURS ONLY!!   COVID SWAB TESTING MUST BE COMPLETED ON:  Monday, Feb. 1, 2021 at 3:20pm   [address], [place] -[facility] enter pre surgical testing line (Must self quarantine after testing. Follow instructions on handout.)         Your procedure is scheduled on: Thursday, Feb. 4, 2021   Report to [facility] Main  Entrance    Report to admitting at 6:30 AM   Call this number if you have problems the morning of surgery [phone]   Do not eat food :After Midnight.   May have liquids until 5:30 AM day surgery   CLEAR LIQUID DIET  Foods Allowed                                                                     Foods Excluded  Water, Black Coffee and tea, regular and decaf                             liquids that you cannot  Plain Jell-O in any flavor  (No red)                                           see through such as: Fruit ices (not with fruit pulp)                                     milk, soups, orange juice  Iced Popsicles (No red)                                    All solid food Carbonated beverages, regular and diet                                    Apple juices Sports drinks like Gatorade (No red) Lightly seasoned clear broth or consume(fat free) Sugar, honey syrup  Sample Menu Breakfast                                Lunch                                     Supper Cranberry juice                    Beef broth                            Chicken broth Jell-O  Grape juice                           Apple juice Coffee or tea                        Jell-O                                      Popsicle                                                Coffee or tea                        Coffee or  tea   Brush your teeth the morning of surgery.   Do NOT smoke after Midnight   Take these medicines the morning of surgery with A SIP OF WATER: Synthroid                               You may not have any metal on your body including hair pins, jewelry, and body piercings             Do not wear make-up, lotions, powders, perfumes/cologne, or deodorant             Do not wear nail polish.  Do not shave  48 hours prior to surgery.                Do not bring valuables to the hospital. [place].   Contacts, dentures or bridgework may not be worn into surgery.     Patients discharged the day of surgery will not be allowed to drive home.   Special Instructions: Bring a copy of your healthcare power of attorney and living will documents         the day of surgery if you haven't scanned them in before.              Please read over the following fact sheets you were given:  [facility] - Preparing for Surgery Before surgery, you can play an important role.  Because skin is not sterile, your skin needs to be as free of germs as possible.  You can reduce the number of germs on your skin by washing with CHG (chlorahexidine gluconate) soap before surgery.  CHG is an antiseptic cleaner which kills germs and bonds with the skin to continue killing germs even after washing. Please DO NOT use if you have an allergy to CHG or antibacterial soaps.  If your skin becomes reddened/irritated stop using the CHG and inform your nurse when you arrive at Short Stay. Do not shave (including legs and underarms) for at least 48 hours prior to the first CHG shower.  You may shave your face/neck.  Please follow these instructions carefully:  1.  Shower with CHG Soap the night before surgery and the  morning of surgery.  2.  If you choose to wash your hair, wash your hair first as usual with your  normal  shampoo.  3.  After you shampoo, rinse your hair and body  thoroughly to remove the shampoo.                             4.  Use CHG as you would any other liquid soap.  You can apply chg directly to the skin and wash.  Gently with a scrungie or clean washcloth.  5.  Apply the CHG Soap to your body ONLY FROM THE NECK DOWN.   Do   not use on face/ open                           Wound or open sores. Avoid contact with eyes, ears mouth and   genitals (private parts).                       Wash face,  Genitals (private parts) with your normal soap.             6.  Wash thoroughly, paying special attention to the area where your    surgery  will be performed.  7.  Thoroughly rinse your body with warm water from the neck down.  8.  DO NOT shower/wash with your normal soap after using and rinsing off the CHG Soap.                9.  Pat yourself dry with a clean towel.            10.  Wear clean pajamas.            11.  Place clean sheets on your bed the night of your first shower and do not  sleep with pets. Day of Surgery : Do not apply any lotions/deodorants the morning of surgery.  Please wear clean clothes to the hospital/surgery center.  FAILURE TO FOLLOW THESE INSTRUCTIONS MAY RESULT IN THE CANCELLATION OF YOUR SURGERY  PATIENT SIGNATURE_________________________________  NURSE SIGNATURE__________________________________  ________________________________________________________________________   Diane [name]  An incentive spirometer is a tool that can help keep your lungs clear and active. This tool measures how well you are filling your lungs with each breath. Taking long deep breaths may help reverse or decrease the chance of developing breathing (pulmonary) problems (especially infection) following:  A long period of time when you are unable to move or be active. BEFORE THE PROCEDURE   If the spirometer includes an indicator to show your best effort, your nurse or respiratory therapist will set it to a desired goal.  If possible,  sit up straight or lean slightly forward. Try not to slouch.  Hold the incentive spirometer in an upright position. INSTRUCTIONS FOR USE  1. Sit on the edge of your bed if possible, or sit up as far as you can in bed or on a chair. 2. Hold the incentive spirometer in an upright position. 3. Breathe out normally. 4. Place the mouthpiece in your mouth and seal your lips tightly around it. 5. Breathe in slowly and as deeply as possible, raising the piston or the ball toward the top of the column. 6. Hold your breath for 3-5 seconds or for as long as possible. Allow the piston or ball to fall to the bottom of the column. 7. Remove the mouthpiece from your mouth and breathe out normally. 8. Rest for a few seconds and repeat Steps 1 through  7 at least 10 times every 1-2 hours when you are awake. Take your time and take a few normal breaths between deep breaths. 9. The spirometer may include an indicator to show your best effort. Use the indicator as a goal to work toward during each repetition. 10. After each set of 10 deep breaths, practice coughing to be sure your lungs are clear. If you have an incision (the cut made at the time of surgery), support your incision when coughing by placing a pillow or rolled up towels firmly against it. Once you are able to get out of bed, walk around indoors and cough well. You may stop using the incentive spirometer when instructed by your caregiver.  RISKS AND COMPLICATIONS  Take your time so you do not get dizzy or light-headed.  If you are in pain, you may need to take or ask for pain medication before doing incentive spirometry. It is harder to take a deep breath if you are having pain. AFTER USE  Rest and breathe slowly and easily.  It can be helpful to keep track of a log of your progress. Your caregiver can provide you with a simple table to help with this. If you are using the spirometer at home, follow these instructions: [place] IF:   You  are having difficultly using the spirometer.  You have trouble using the spirometer as often as instructed.  Your pain medication is not giving enough relief while using the spirometer.  You develop fever of 100.5 F (38.1 C) or higher. SEEK IMMEDIATE MEDICAL CARE IF:   You cough up bloody sputum that had not been present before.  You develop fever of 102 F (38.9 C) or greater.  You develop worsening pain at or near the incision site. MAKE SURE YOU:   Understand these instructions.  Will watch your condition.  Will get help right away if you are not doing well or get worse. Document Released: 09/13/2006 Document Revised: 07/26/2011 Document Reviewed: 11/14/2006 Exit[employer] Patient Information 2014 [employer], [place].   ________________________________________________________________________  WHAT IS A BLOOD TRANSFUSION? Blood Transfusion Information  A transfusion is the replacement of blood or some of its parts. Blood is made up of multiple cells which provide different functions.  Red blood cells carry oxygen and are used for blood loss replacement.  White blood cells fight against infection.  Platelets control bleeding.  Plasma helps clot blood.  Other blood products are available for specialized needs, such as hemophilia or other clotting disorders. BEFORE THE TRANSFUSION  Who gives blood for transfusions?   Healthy volunteers who are fully evaluated to make sure their blood is safe. This is blood bank blood. Transfusion therapy is the safest it has ever been in the practice of medicine. Before blood is taken from a donor, a complete history is taken to make sure that person has no history of diseases nor engages in risky social behavior (examples are intravenous drug use or sexual activity with multiple partners). The donor's travel history is screened to minimize risk of transmitting infections, such as malaria. The donated blood is tested for signs of infectious  diseases, such as HIV and hepatitis. The blood is then tested to be sure it is compatible with you in order to minimize the chance of a transfusion reaction. If you or a relative donates blood, this is often done in anticipation of surgery and is not appropriate for emergency situations. It takes many days to process the donated blood. RISKS AND COMPLICATIONS  Although transfusion therapy is very safe and saves many lives, the main dangers of transfusion include:   Getting an infectious disease.  Developing a transfusion reaction. This is an allergic reaction to something in the blood you were given. Every precaution is taken to prevent this. The decision to have a blood transfusion has been considered carefully by your caregiver before blood is given. Blood is not given unless the benefits outweigh the risks. AFTER THE TRANSFUSION  Right after receiving a blood transfusion, you will usually feel much better and more energetic. This is especially true if your red blood cells have gotten low (anemic). The transfusion raises the level of the red blood cells which carry oxygen, and this usually causes an energy increase.  The nurse administering the transfusion will monitor you carefully for complications. HOME CARE INSTRUCTIONS  No special instructions are needed after a transfusion. You may find your energy is better. Speak with your caregiver about any limitations on activity for underlying diseases you may have. SEEK MEDICAL CARE IF:   Your condition is not improving after your transfusion.  You develop redness or irritation at the intravenous (IV) site. SEEK IMMEDIATE MEDICAL CARE IF:  Any of the following symptoms occur over the next 12 hours:  Shaking chills.  You have a temperature by mouth above 102 F (38.9 C), not controlled by medicine.  Chest, back, or muscle pain.  People around you feel you are not acting correctly or are confused.  Shortness of breath or difficulty  breathing.  Dizziness and fainting.  You get a rash or develop hives.  You have a decrease in urine output.  Your urine turns a dark color or changes to pink, red, or brown. Any of the following symptoms occur over the next 10 days:  You have a temperature by mouth above 102 F (38.9 C), not controlled by medicine.  Shortness of breath.  Weakness after normal activity.  The white part of the eye turns yellow (jaundice).  You have a decrease in the amount of urine or are urinating less often.  Your urine turns a dark color or changes to pink, red, or brown. Document Released: 04/30/2000 Document Revised: 07/26/2011 Document Reviewed: 12/18/2007 [facility] Patient Information 2014 [place], [place].  _______________________________________________________________________

## 2019-06-18 ENCOUNTER — Other Ambulatory Visit: Payer: Self-pay

## 2019-06-18 ENCOUNTER — Encounter (HOSPITAL_COMMUNITY)
Admission: RE | Admit: 2019-06-18 | Discharge: 2019-06-18 | Disposition: A | Discharge status: Home / Self Care | Payer: Medicare HMO | Source: Ambulatory Visit | Attending: Gynecologic Oncology | Admitting: Gynecologic Oncology

## 2019-06-18 ENCOUNTER — Encounter (HOSPITAL_COMMUNITY): Payer: Self-pay

## 2019-06-18 ENCOUNTER — Other Ambulatory Visit (HOSPITAL_COMMUNITY)
Admission: RE | Admit: 2019-06-18 | Discharge: 2019-06-18 | Disposition: A | Discharge status: Home / Self Care | Payer: Medicare HMO | Source: Ambulatory Visit | Attending: Gynecologic Oncology | Admitting: Gynecologic Oncology

## 2019-06-18 DIAGNOSIS — Z20822 Contact with and (suspected) exposure to covid-19: Secondary | ICD-10-CM | POA: Diagnosis not present

## 2019-06-18 DIAGNOSIS — Z01812 Encounter for preprocedural laboratory examination: Secondary | ICD-10-CM | POA: Insufficient documentation

## 2019-06-18 HISTORY — DX: Malignant neoplasm of uterus, part unspecified: C55

## 2019-06-18 HISTORY — DX: Postmenopausal bleeding: N95.0

## 2019-06-18 HISTORY — DX: Malignant neoplasm of endometrium: C54.1

## 2019-06-18 LAB — COMPREHENSIVE METABOLIC PANEL
ALT: 42 U/L (ref 0–44)
AST: 32 U/L (ref 15–41)
Albumin: 4.1 g/dL (ref 3.5–5.0)
Alkaline Phosphatase: 62 U/L (ref 38–126)
Anion gap: 8 (ref 5–15)
BUN: 19 mg/dL (ref 8–23)
CO2: 27 mmol/L (ref 22–32)
Calcium: 9.2 mg/dL (ref 8.9–10.3)
Chloride: 106 mmol/L (ref 98–111)
Creatinine, Ser: 0.9 mg/dL (ref 0.44–1.00)
GFR calc Af Amer: >60 mL/min (ref >60)
GFR calc non Af Amer: >60 mL/min (ref >60)
Glucose, Bld: 90 mg/dL (ref 70–99)
Potassium: 4.2 mmol/L (ref 3.5–5.1)
Sodium: 141 mmol/L (ref 135–145)
Total Bilirubin: 0.7 mg/dL (ref 0.3–1.2)
Total Protein: 6.8 g/dL (ref 6.5–8.1)

## 2019-06-18 LAB — URINALYSIS, ROUTINE W REFLEX MICROSCOPIC
Bacteria, UA: NONE SEEN
Bilirubin Urine: NEGATIVE
Glucose, UA: NEGATIVE mg/dL
Ketones, ur: NEGATIVE mg/dL
Nitrite: NEGATIVE
Protein, ur: 30 mg/dL — AB
Specific Gravity, Urine: 1.01 (ref 1.005–1.030)
pH: 6 (ref 5.0–8.0)

## 2019-06-18 LAB — CBC
HCT: 39 % (ref 36.0–46.0)
Hemoglobin: 12.4 g/dL (ref 12.0–15.0)
MCH: 32.9 pg (ref 26.0–34.0)
MCHC: 31.8 g/dL (ref 30.0–36.0)
MCV: 103.4 fL — ABNORMAL HIGH (ref 80.0–100.0)
Platelets: 176 10*3/uL (ref 150–400)
RBC: 3.77 MIL/uL — ABNORMAL LOW (ref 3.87–5.11)
RDW: 13.7 % (ref 11.5–15.5)
WBC: 5.1 10*3/uL (ref 4.0–10.5)
nRBC: 0 % (ref 0.0–0.2)

## 2019-06-18 LAB — ABO/RH: ABO/RH(D): O NEG

## 2019-06-18 NOTE — Progress Notes (Signed)
PCP - Dr. Lenna Sciara. [place] Cardiologist - no  Chest x-ray - no EKG - 2015 Stress Test - no ECHO - on Cardiac Cath - no  Sleep Study - no CPAP - no  Fasting Blood Sugar - NA Checks Blood Sugar _____ times a day  Blood Thinner Instructions:NA Aspirin Instructions: Last Dose:  Anesthesia review:   Patient denies shortness of breath, fever, cough and chest pain at PAT appointment Yes  Patient verbalized understanding of instructions that were given to them at the PAT appointment. Patient was also instructed that they will need to review over the PAT instructions again at home before surgery. Yes  Pt reports that she had covid in December 2020. [facility] [place] I told her that she needs to get documentation of he + covid test. She falls in the 90 day policy but she did get a covid test 06/18/19 at [facility].

## 2019-06-19 LAB — SARS CORONAVIRUS 2 (TAT 6-24 HRS): SARS Coronavirus 2: NEGATIVE

## 2019-06-20 ENCOUNTER — Telehealth: Payer: Self-pay

## 2019-06-20 ENCOUNTER — Encounter (HOSPITAL_COMMUNITY): Payer: Self-pay | Admitting: Gynecologic Oncology

## 2019-06-20 ENCOUNTER — Ambulatory Visit: Payer: Medicare HMO | Admitting: Obstetrics & Gynecology

## 2019-06-20 NOTE — Telephone Encounter (Signed)
Ms health states that she understands her pre op instructions and has no questions or concerns at this time.

## 2019-06-21 ENCOUNTER — Other Ambulatory Visit: Payer: Self-pay

## 2019-06-21 ENCOUNTER — Ambulatory Visit (HOSPITAL_COMMUNITY)
Admission: RE | Admit: 2019-06-21 | Discharge: 2019-06-21 | Disposition: A | Discharge status: Home / Self Care | Payer: Medicare HMO | Attending: Gynecologic Oncology | Admitting: Gynecologic Oncology

## 2019-06-21 ENCOUNTER — Encounter (HOSPITAL_COMMUNITY): Payer: Self-pay | Admitting: Gynecologic Oncology

## 2019-06-21 ENCOUNTER — Ambulatory Visit (HOSPITAL_COMMUNITY): Payer: Medicare HMO | Admitting: Physician Assistant

## 2019-06-21 ENCOUNTER — Encounter (HOSPITAL_COMMUNITY)
Admission: RE | Disposition: A | Discharge status: Home / Self Care | Payer: Self-pay | Source: Home / Self Care | Attending: Gynecologic Oncology

## 2019-06-21 ENCOUNTER — Ambulatory Visit (HOSPITAL_COMMUNITY): Payer: Medicare HMO | Admitting: Anesthesiology

## 2019-06-21 DIAGNOSIS — Z803 Family history of malignant neoplasm of breast: Secondary | ICD-10-CM | POA: Diagnosis not present

## 2019-06-21 DIAGNOSIS — L723 Sebaceous cyst: Secondary | ICD-10-CM | POA: Diagnosis not present

## 2019-06-21 DIAGNOSIS — R222 Localized swelling, mass and lump, trunk: Secondary | ICD-10-CM

## 2019-06-21 DIAGNOSIS — Z79890 Hormone replacement therapy (postmenopausal): Secondary | ICD-10-CM | POA: Diagnosis not present

## 2019-06-21 DIAGNOSIS — L720 Epidermal cyst: Secondary | ICD-10-CM | POA: Insufficient documentation

## 2019-06-21 DIAGNOSIS — C541 Malignant neoplasm of endometrium: Secondary | ICD-10-CM | POA: Diagnosis not present

## 2019-06-21 DIAGNOSIS — E039 Hypothyroidism, unspecified: Secondary | ICD-10-CM | POA: Diagnosis not present

## 2019-06-21 DIAGNOSIS — M8580 Other specified disorders of bone density and structure, unspecified site: Secondary | ICD-10-CM | POA: Diagnosis not present

## 2019-06-21 HISTORY — PX: ROBOTIC ASSISTED TOTAL HYSTERECTOMY WITH BILATERAL SALPINGO OOPHERECTOMY: SHX6086

## 2019-06-21 HISTORY — PX: SENTINEL NODE BIOPSY: SHX6608

## 2019-06-21 LAB — TYPE AND SCREEN
ABO/RH(D): O NEG
Antibody Screen: NEGATIVE

## 2019-06-21 SURGERY — HYSTERECTOMY, TOTAL, ROBOT-ASSISTED, LAPAROSCOPIC, WITH BILATERAL SALPINGO-OOPHORECTOMY
Anesthesia: General

## 2019-06-21 MED ORDER — LIDOCAINE HCL 2 % IJ SOLN
INTRAMUSCULAR | Status: AC
  Filled 2019-06-21: qty 20

## 2019-06-21 MED ORDER — CEFAZOLIN SODIUM-DEXTROSE 2-4 GM/100ML-% IV SOLN
2.0000 g | INTRAVENOUS | Status: AC
  Administered 2019-06-21: 2 g via INTRAVENOUS
  Filled 2019-06-21: qty 100

## 2019-06-21 MED ORDER — OXYCODONE HCL 5 MG PO TABS
ORAL_TABLET | ORAL | Status: AC
  Administered 2019-06-21: 5 mg via ORAL
  Filled 2019-06-21: qty 1

## 2019-06-21 MED ORDER — DEXAMETHASONE SODIUM PHOSPHATE 4 MG/ML IJ SOLN: 4.0000 mg | INTRAMUSCULAR | Status: DC

## 2019-06-21 MED ORDER — LACTATED RINGERS IV SOLN
INTRAVENOUS | Status: DC
  Administered 2019-06-21: 1000 mL via INTRAVENOUS

## 2019-06-21 MED ORDER — BUPIVACAINE HCL 0.25 % IJ SOLN
INTRAMUSCULAR | Status: DC | PRN
  Administered 2019-06-21: 20 mL

## 2019-06-21 MED ORDER — ONDANSETRON HCL 4 MG/2ML IJ SOLN
INTRAMUSCULAR | Status: DC | PRN
  Administered 2019-06-21: 4 mg via INTRAVENOUS

## 2019-06-21 MED ORDER — OXYCODONE HCL 5 MG PO TABS: 5.0000 mg | ORAL_TABLET | ORAL | Status: DC | PRN

## 2019-06-21 MED ORDER — SODIUM CHLORIDE 0.9 % IV SOLN: 250.0000 mL | INTRAVENOUS | Status: DC | PRN

## 2019-06-21 MED ORDER — LIDOCAINE 2% (20 MG/ML) 5 ML SYRINGE
INTRAMUSCULAR | Status: DC | PRN
  Administered 2019-06-21: 60 mg via INTRAVENOUS

## 2019-06-21 MED ORDER — STERILE WATER FOR INJECTION IJ SOLN
INTRAMUSCULAR | Status: DC | PRN
  Administered 2019-06-21: 4 mL

## 2019-06-21 MED ORDER — ROCURONIUM BROMIDE 10 MG/ML (PF) SYRINGE
PREFILLED_SYRINGE | INTRAVENOUS | Status: DC | PRN
  Administered 2019-06-21: 40 mg via INTRAVENOUS
  Administered 2019-06-21: 10 mg via INTRAVENOUS

## 2019-06-21 MED ORDER — LIDOCAINE 20MG/ML (2%) 15 ML SYRINGE OPTIME
INTRAMUSCULAR | Status: DC | PRN
  Administered 2019-06-21: 1.5 mg/kg/h via INTRAVENOUS

## 2019-06-21 MED ORDER — FENTANYL CITRATE (PF) 100 MCG/2ML IJ SOLN: 25.0000 ug | INTRAMUSCULAR | Status: DC | PRN

## 2019-06-21 MED ORDER — MIDAZOLAM HCL 5 MG/5ML IJ SOLN
INTRAMUSCULAR | Status: DC | PRN
  Administered 2019-06-21: 1 mg via INTRAVENOUS

## 2019-06-21 MED ORDER — PROPOFOL 10 MG/ML IV BOLUS
INTRAVENOUS | Status: DC | PRN
  Administered 2019-06-21: 120 mg via INTRAVENOUS

## 2019-06-21 MED ORDER — INDOCYANINE GREEN 25 MG IV SOLR
INTRAVENOUS | Status: DC | PRN
  Administered 2019-06-21: 2.5 mg

## 2019-06-21 MED ORDER — SCOPOLAMINE 1 MG/3DAYS TD PT72
1.0000 | MEDICATED_PATCH | TRANSDERMAL | Status: DC
  Administered 2019-06-21: 1.5 mg via TRANSDERMAL
  Filled 2019-06-21: qty 1

## 2019-06-21 MED ORDER — GABAPENTIN 300 MG PO CAPS
300.0000 mg | ORAL_CAPSULE | ORAL | Status: AC
  Administered 2019-06-21: 300 mg via ORAL
  Filled 2019-06-21: qty 1

## 2019-06-21 MED ORDER — PHENYLEPHRINE 40 MCG/ML (10ML) SYRINGE FOR IV PUSH (FOR BLOOD PRESSURE SUPPORT)
PREFILLED_SYRINGE | INTRAVENOUS | Status: DC | PRN
  Administered 2019-06-21: 100 ug via INTRAVENOUS

## 2019-06-21 MED ORDER — ACETAMINOPHEN 500 MG PO TABS
1000.0000 mg | ORAL_TABLET | ORAL | Status: AC
  Administered 2019-06-21: 1000 mg via ORAL
  Filled 2019-06-21: qty 2

## 2019-06-21 MED ORDER — SODIUM CHLORIDE 0.9% FLUSH: 3.0000 mL | Freq: Two times a day (BID) | INTRAVENOUS | Status: DC

## 2019-06-21 MED ORDER — LACTATED RINGERS IR SOLN
Status: DC | PRN
  Administered 2019-06-21: 1000 mL

## 2019-06-21 MED ORDER — SUCCINYLCHOLINE CHLORIDE 200 MG/10ML IV SOSY
PREFILLED_SYRINGE | INTRAVENOUS | Status: DC | PRN
  Administered 2019-06-21: 40 mg via INTRAVENOUS

## 2019-06-21 MED ORDER — STERILE WATER FOR INJECTION IJ SOLN
INTRAMUSCULAR | Status: AC
  Filled 2019-06-21: qty 10

## 2019-06-21 MED ORDER — FENTANYL CITRATE (PF) 100 MCG/2ML IJ SOLN
INTRAMUSCULAR | Status: DC | PRN
  Administered 2019-06-21: 75 ug via INTRAVENOUS
  Administered 2019-06-21: 25 ug via INTRAVENOUS

## 2019-06-21 MED ORDER — MORPHINE SULFATE (PF) 4 MG/ML IV SOLN: 2.0000 mg | INTRAVENOUS | Status: DC | PRN

## 2019-06-21 MED ORDER — ACETAMINOPHEN 650 MG RE SUPP
650.0000 mg | RECTAL | Status: DC | PRN
  Filled 2019-06-21: qty 1

## 2019-06-21 MED ORDER — MIDAZOLAM HCL 2 MG/2ML IJ SOLN
INTRAMUSCULAR | Status: AC
  Filled 2019-06-21: qty 2

## 2019-06-21 MED ORDER — DEXAMETHASONE SODIUM PHOSPHATE 10 MG/ML IJ SOLN
INTRAMUSCULAR | Status: DC | PRN
  Administered 2019-06-21: 10 mg via INTRAVENOUS

## 2019-06-21 MED ORDER — ONDANSETRON HCL 4 MG/2ML IJ SOLN: 4.0000 mg | Freq: Once | INTRAMUSCULAR | Status: DC | PRN

## 2019-06-21 MED ORDER — EPHEDRINE SULFATE-NACL 50-0.9 MG/10ML-% IV SOSY
PREFILLED_SYRINGE | INTRAVENOUS | Status: DC | PRN
  Administered 2019-06-21: 5 mg via INTRAVENOUS

## 2019-06-21 MED ORDER — DIPHENHYDRAMINE HCL 50 MG/ML IJ SOLN
INTRAMUSCULAR | Status: DC | PRN
  Administered 2019-06-21: 12.5 mg via INTRAVENOUS

## 2019-06-21 MED ORDER — ACETAMINOPHEN 325 MG PO TABS: 650.0000 mg | ORAL_TABLET | ORAL | Status: DC | PRN

## 2019-06-21 MED ORDER — FENTANYL CITRATE (PF) 100 MCG/2ML IJ SOLN
INTRAMUSCULAR | Status: AC
  Filled 2019-06-21: qty 2

## 2019-06-21 MED ORDER — SODIUM CHLORIDE 0.9% FLUSH: 3.0000 mL | INTRAVENOUS | Status: DC | PRN

## 2019-06-21 MED ORDER — BUPIVACAINE HCL 0.25 % IJ SOLN
INTRAMUSCULAR | Status: AC
  Filled 2019-06-21: qty 1

## 2019-06-21 MED ORDER — GLYCOPYRROLATE PF 0.2 MG/ML IJ SOSY
PREFILLED_SYRINGE | INTRAMUSCULAR | Status: DC | PRN
  Administered 2019-06-21: .1 mg via INTRAVENOUS

## 2019-06-21 MED ORDER — SUGAMMADEX SODIUM 200 MG/2ML IV SOLN
INTRAVENOUS | Status: DC | PRN
  Administered 2019-06-21: 150 mg via INTRAVENOUS

## 2019-06-21 MED ORDER — ENOXAPARIN SODIUM 40 MG/0.4ML ~~LOC~~ SOLN
40.0000 mg | SUBCUTANEOUS | Status: AC
  Administered 2019-06-21: 40 mg via SUBCUTANEOUS
  Filled 2019-06-21: qty 0.4

## 2019-06-21 MED ORDER — PROPOFOL 10 MG/ML IV BOLUS
INTRAVENOUS | Status: AC
  Filled 2019-06-21: qty 20

## 2019-06-21 SURGICAL SUPPLY — 72 items
ADH SKN CLS APL DERMABOND .7 (GAUZE/BANDAGES/DRESSINGS) ×2
AGENT HMST KT MTR STRL THRMB (HEMOSTASIS)
APL ESCP 34 STRL LF DISP (HEMOSTASIS)
APPLICATOR SURGIFLO ENDO (HEMOSTASIS) IMPLANT
BACTOSHIELD CHG 4% 4OZ (MISCELLANEOUS) ×2
BAG LAPAROSCOPIC 12 15 PORT 16 (BASKET) IMPLANT
BAG RETRIEVAL 12/15 (BASKET)
BAG RETRIEVAL 12/15MM (BASKET)
BAG SPEC RTRVL LRG 6X4 10 (ENDOMECHANICALS)
BLADE SURG SZ10 CARB STEEL (BLADE) IMPLANT
COVER BACK TABLE 60X90IN (DRAPES) ×4 IMPLANT
COVER TIP SHEARS 8 DVNC (MISCELLANEOUS) ×2 IMPLANT
COVER TIP SHEARS 8MM DA VINCI (MISCELLANEOUS) ×2
COVER WAND RF STERILE (DRAPES) IMPLANT
DECANTER SPIKE VIAL GLASS SM (MISCELLANEOUS) ×4 IMPLANT
DERMABOND ADVANCED (GAUZE/BANDAGES/DRESSINGS) ×2
DERMABOND ADVANCED .7 DNX12 (GAUZE/BANDAGES/DRESSINGS) ×2 IMPLANT
DRAPE ARM DVNC X/XI (DISPOSABLE) ×8 IMPLANT
DRAPE COLUMN DVNC XI (DISPOSABLE) ×2 IMPLANT
DRAPE DA VINCI XI ARM (DISPOSABLE) ×8
DRAPE DA VINCI XI COLUMN (DISPOSABLE) ×2
DRAPE SHEET LG 3/4 BI-LAMINATE (DRAPES) ×4 IMPLANT
DRAPE SURG IRRIG POUCH 19X23 (DRAPES) ×4 IMPLANT
DRSG OPSITE POSTOP 4X6 (GAUZE/BANDAGES/DRESSINGS) IMPLANT
DRSG OPSITE POSTOP 4X8 (GAUZE/BANDAGES/DRESSINGS) IMPLANT
ELECT PENCIL ROCKER SW 15FT (MISCELLANEOUS) ×2 IMPLANT
ELECT REM PT RETURN 15FT ADLT (MISCELLANEOUS) ×4 IMPLANT
GLOVE BIO SURGEON STRL SZ 6 (GLOVE) ×16 IMPLANT
GLOVE BIO SURGEON STRL SZ 6.5 (GLOVE) ×3 IMPLANT
GLOVE BIO SURGEONS STRL SZ 6.5 (GLOVE) ×3
GOWN STRL REUS W/ TWL LRG LVL3 (GOWN DISPOSABLE) ×8 IMPLANT
GOWN STRL REUS W/TWL LRG LVL3 (GOWN DISPOSABLE) ×20
HOLDER FOLEY CATH W/STRAP (MISCELLANEOUS) ×2 IMPLANT
IRRIG SUCT STRYKERFLOW 2 WTIP (MISCELLANEOUS) ×4
IRRIGATION SUCT STRKRFLW 2 WTP (MISCELLANEOUS) ×2 IMPLANT
KIT PROCEDURE DA VINCI SI (MISCELLANEOUS) ×2
KIT PROCEDURE DVNC SI (MISCELLANEOUS) IMPLANT
KIT TURNOVER KIT A (KITS) ×2 IMPLANT
MANIPULATOR UTERINE 4.5 ZUMI (MISCELLANEOUS) ×4 IMPLANT
NDL SPNL 18GX3.5 QUINCKE PK (NEEDLE) IMPLANT
NEEDLE HYPO 22GX1.5 SAFETY (NEEDLE) ×2 IMPLANT
NEEDLE SPNL 18GX3.5 QUINCKE PK (NEEDLE) ×4 IMPLANT
OBTURATOR OPTICAL STANDARD 8MM (TROCAR) ×2
OBTURATOR OPTICAL STND 8 DVNC (TROCAR) ×2
OBTURATOR OPTICALSTD 8 DVNC (TROCAR) ×2 IMPLANT
PACK ROBOT GYN CUSTOM WL (TRAY / TRAY PROCEDURE) ×4 IMPLANT
PAD POSITIONING PINK XL (MISCELLANEOUS) ×4 IMPLANT
PENCIL SMOKE EVACUATOR (MISCELLANEOUS) IMPLANT
PORT ACCESS TROCAR AIRSEAL 12 (TROCAR) ×2 IMPLANT
PORT ACCESS TROCAR AIRSEAL 5M (TROCAR) ×2
POUCH SPECIMEN RETRIEVAL 10MM (ENDOMECHANICALS) IMPLANT
SCRUB CHG 4% DYNA-HEX 4OZ (MISCELLANEOUS) ×2 IMPLANT
SEAL CANN UNIV 5-8 DVNC XI (MISCELLANEOUS) ×6 IMPLANT
SEAL XI 5MM-8MM UNIVERSAL (MISCELLANEOUS) ×6
SET TRI-LUMEN FLTR TB AIRSEAL (TUBING) ×4 IMPLANT
SPONGE LAP 18X18 RF (DISPOSABLE) IMPLANT
SURGIFLO W/THROMBIN 8M KIT (HEMOSTASIS) IMPLANT
SUT MNCRL AB 4-0 PS2 18 (SUTURE) IMPLANT
SUT PDS AB 1 TP1 96 (SUTURE) IMPLANT
SUT VIC AB 0 CT1 27 (SUTURE)
SUT VIC AB 0 CT1 27XBRD ANTBC (SUTURE) IMPLANT
SUT VIC AB 2-0 CT1 27 (SUTURE)
SUT VIC AB 2-0 CT1 TAPERPNT 27 (SUTURE) IMPLANT
SUT VICRYL 4-0 PS2 18IN ABS (SUTURE) ×8 IMPLANT
SYR 10ML LL (SYRINGE) ×2 IMPLANT
TOWEL OR NON WOVEN STRL DISP B (DISPOSABLE) ×4 IMPLANT
TRAP SPECIMEN MUCOUS 40CC (MISCELLANEOUS) IMPLANT
TRAY FOLEY MTR SLVR 16FR STAT (SET/KITS/TRAYS/PACK) ×4 IMPLANT
TROCAR XCEL NON-BLD 5MMX100MML (ENDOMECHANICALS) IMPLANT
UNDERPAD 30X36 HEAVY ABSORB (UNDERPADS AND DIAPERS) ×6 IMPLANT
WATER STERILE IRR 1000ML POUR (IV SOLUTION) ×4 IMPLANT
YANKAUER SUCT BULB TIP 10FT TU (MISCELLANEOUS) IMPLANT

## 2019-06-21 NOTE — Anesthesia Procedure Notes (Signed)
Procedure Name: Intubation Date/Time: 06/21/2019 8:55 AM Performed by: Lavina Hamman, CRNA Pre-anesthesia Checklist: Patient identified, Suction available, Patient being monitored, Emergency Drugs available and Timeout performed Patient Re-evaluated:Patient Re-evaluated prior to induction Oxygen Delivery Method: Circle system utilized Preoxygenation: Pre-oxygenation with 100% oxygen Induction Type: IV induction Ventilation: Mask ventilation without difficulty Laryngoscope Size: Mac and 3 Grade View: Grade I Tube type: Oral Tube size: 7.5 mm Number of attempts: 1 Airway Equipment and Method: Stylet Placement Confirmation: ETT inserted through vocal cords under direct vision Secured at: 24 cm Tube secured with: Tape

## 2019-06-21 NOTE — Discharge Instructions (Signed)
Return to work: 4 weeks (2 weeks with physical restrictions).  Activity: 1. Be up and out of the bed during the day.  Take a nap if needed.  You may walk up steps but be careful and use the hand rail.  Stair climbing will tire you more than you think, you may need to stop part way and rest.   2. No lifting or straining for 4 weeks.  3. No driving for 1 weeks.  Do Not drive if you are taking narcotic pain medicine.  4. Shower daily.  Use soap and water on your incision and pat dry; don't rub.   5. No sexual activity and nothing in the vagina for 8 weeks.  Medications:  - Take ibuprofen and tylenol first line for pain control. Take these regularly (every 6 hours) to decrease the build up of pain.  - If necessary, for severe pain not relieved by ibuprofen, contact Dr Rossi's office and you will be prescribed percocet.  - While taking percocet you should take sennakot every night to reduce the likelihood of constipation. If this causes diarrhea, stop its use.  Diet: 1. Low sodium Heart Healthy Diet is recommended.  2. It is safe to use a laxative if you have difficulty moving your bowels.   Wound Care: 1. Keep clean and dry.  Shower daily.  Reasons to call the Doctor:   Fever - Oral temperature greater than 100.4 degrees Fahrenheit  Foul-smelling vaginal discharge  Difficulty urinating  Nausea and vomiting  Increased pain at the site of the incision that is unrelieved with pain medicine.  Difficulty breathing with or without chest pain  New calf pain especially if only on one side  Sudden, continuing increased vaginal bleeding with or without clots.   Follow-up: 1. See Emma Rossi in 4 weeks.  Contacts: For questions or concerns you should contact:  Dr. Emma Rossi at [phone] After hours and on week-ends call [phone] and ask to speak to the physician on call for Gynecologic Oncology  After Your Surgery  The information in this section will tell you what  to expect after your surgery, both during your stay and after you leave. You will learn how to safely recover from your surgery. Write down any questions you have and be sure to ask your doctor or nurse.  What to Expect When you wake up after your surgery, you will be in the Post-Anesthesia Care Unit (PACU) or your recovery room. A nurse will be monitoring your body temperature, blood pressure, pulse, and oxygen levels. You may have a urinary catheter in your bladder to help monitor the amount of urine you are making. It should come out before you go home. You will also have compression boots on your lower legs to help your circulation. Your pain medication will be given through an IV line or in tablet form. If you are having pain, tell your nurse. Your nurse will tell you how to recover from your surgery. Below are examples of ways you can help yourself recover safely. . You will be encouraged to walk with the help of your nurse or physical therapist. We will give you medication to relieve pain. Walking helps reduce the risk for blood clots and pneumonia. It also helps to stimulate your bowels so they begin working again. . Use your incentive spirometer. This will help your lungs expand, which prevents pneumonia.   Commonly Asked Questions  Will I have pain after surgery? Yes, you will have some   pain after your surgery, especially in the first few days. Your doctor and nurse will ask you about your pain often. You will be given medication to manage your pain as needed. If your pain is not relieved, please tell your doctor or nurse. It is important to control your pain so you can cough, breathe deeply, use your incentive spirometer, and get out of bed and walk.  Will I be able to eat? Yes, you will be able to eat a regular diet or eat as tolerated. You should start with foods that are soft and easy to digest such as apple sauce and chicken noodle soup. Eat small meals frequently, and then advance  to regular foods. If you experience bloating, gas, or cramps, limit high-fiber foods, including whole grain breads and cereal, nuts, seeds, salads, fresh fruit, broccoli, cabbage, and cauliflower. Will I have pain when I am home? The length of time each person has pain or discomfort varies. You may still have some pain when you go home and will probably be taking pain medication. Follow the guidelines below. . Take your medications as directed and as needed. . Call your doctor if the medication prescribed for you doesn't relieve your pain. . Don't drive or drink alcohol while you're taking prescription pain medication. . As your incision heals, you will have less pain and need less pain medication. A mild pain reliever such as acetaminophen (Tylenol) or ibuprofen (Advil) will relieve aches and discomfort. However, large quantities of acetaminophen may be harmful to your liver. Don't take more acetaminophen than the amount directed on the bottle or as instructed by your doctor or nurse. . Pain medication should help you as you resume your normal activities. Take enough medication to do your exercises comfortably. Pain medication is most effective 30 to 45 minutes after taking it. . Keep track of when you take your pain medication. Taking it when your pain first begins is more effective than waiting for the pain to get worse. Pain medication may cause constipation (having fewer bowel movements than what is normal for you).  How can I prevent constipation? . Go to the bathroom at the same time every day. Your body will get used to going at that time. . If you feel the urge to go, don't put it off. Try to use the bathroom 5 to 15 minutes after meals. . After breakfast is a Marques Ericson time to move your bowels. The reflexes in your colon are strongest at this time. . Exercise, if you can. Walking is an excellent form of exercise. . Drink 8 (8-ounce) glasses (2 liters) of liquids daily, if you can. Drink  water, juices, soups, ice cream shakes, and other drinks that don't have caffeine. Drinks with caffeine, such as coffee and soda, pull fluid out of the body. . Slowly increase the fiber in your diet to 25 to 35 grams per day. Fruits, vegetables, whole grains, and cereals contain fiber. If you have an ostomy or have had recent bowel surgery, check with your doctor or nurse before making any changes in your diet. . Both over-the-counter and prescription medications are available to treat constipation. Start with 1 of the following over-the-counter medications first: o Docusate sodium (Colace) 100 mg. Take ___1__ capsules _2____ times a day. This is a stool softener that causes few side effects. Don't take it with mineral oil. o Polyethylene glycol (MiraLAX) 17 grams daily. o Senna (Senokot) 2 tablets at bedtime. This is a stimulant laxative, which can cause cramping. .   If you haven't had a bowel movement in 2 days, call your doctor or nurse.  Can I shower? Yes, you should shower 24 hours after your surgery. Be sure to shower every day. Taking a warm shower is relaxing and can help decrease muscle aches. Use soap when you shower and gently wash your incision. Pat the areas dry with a towel after showering, and leave your incision uncovered (unless there is drainage). Call your doctor if you see any redness or drainage from your incision. Don't take tub baths until you discuss it with your doctor at the first appointment after your surgery. How do I care for my incisions? You will have several small incisions on your abdomen. The incisions are closed with Steri-Strips or Dermabond. You may also have square white dressings on your incisions ([place]). You can remove these in the shower 24 hours after your surgery. You should clean your incisions with soap and water. If you go home with Steri-Strips on your incision, they will loosen and may fall off by themselves. If they haven't fallen off within 10  days, you can remove them. If you go home with Dermabond over your sutures (stitches), it will also loosen and peel off.  What are the most common symptoms after a hysterectomy? It's common for you to have some vaginal spotting or light bleeding. You should monitor this with a pad or a panty liner. If you have having heavy bleeding (bleeding through a pad or liner every 1 to 2 hours), call your doctor right away. It's also common to have some discomfort after surgery from the air that was pumped into your abdomen during surgery. To help with this, walk, drink plenty of liquids and make sure to take the stool softeners you received.  When is it safe for me to drive? You may resume driving 2 weeks after surgery, as long as you are not taking pain medication that may make you drowsy.  When can I resume sexual activity? Do not place anything in your vagina or have vaginal intercourse for 8 weeks after your surgery. Some people will need to wait longer than 8 weeks, so speak with your doctor before resuming sexual intercourse.  Will I be able to travel? Yes, you can travel. If you are traveling by plane within a few weeks after your surgery, make sure you get up and walk every hour. Be sure to stretch your legs, drink plenty of liquids, and keep your feet elevated when possible.  Will I need any supplies? Most people do not need any supplies after the surgery. In the rare case that you do need supplies, such as tubes or drains, your nurse will order them for you.  When can I return to work? The time it takes to return to work depends on the type of work you do, the type of surgery you had, and how fast your body heals. Most people can return to work about 2 to 4 weeks after the surgery.  What exercises can I do? Exercise will help you gain strength and feel better. Walking and stair climbing are excellent forms of exercise. Gradually increase the distance you walk. Climb stairs slowly, resting or  stopping as needed. Ask your doctor or nurse before starting more strenuous exercises.  When can I lift heavy objects? Most people should not lift anything heavier than 10 pounds (4.5 kilograms) for at least 4 weeks after surgery. Speak with your doctor about when you can do heavy lifting.    How can I cope with my feelings? After surgery for a serious illness, you may have new and upsetting feelings. Many people say they felt weepy, sad, worried, nervous, irritable, and angry at one time or another. You may find that you can't control some of these feelings. If this happens, it's a Lathen Seal idea to seek emotional support. The first step in coping is to talk about how you feel. Family and friends can help. Your nurse, doctor, and social worker can reassure, support, and guide you. It's always a Luken Shadowens idea to let these professionals know how you, your family, and your friends are feeling emotionally. Many resources are available to patients and their families. Whether you're in the hospital or at home, the nurses, doctors, and social workers are here to help you and your family and friends handle the emotional aspects of your illness.  When is my first appointment after surgery? Your first appointment after surgery will be 2 to 4 weeks after surgery. Your nurse will give you instructions on how to make this appointment, including the phone number to call.  What if I have other questions? If you have any questions or concerns, please talk with your doctor or nurse. You can reach them Monday through Friday from 9:00 am to 5:00 pm. After 5:00 pm, during the weekend, and on holidays, call [phone] and ask for the doctor on call for your doctor.  . Have a temperature of 101 F (38.3 C) or higher . Have pain that does not get better with pain medication . Have redness, drainage, or swelling from your incisions   General Anesthesia, Adult, Care After This sheet gives you information about how to care for  yourself after your procedure. Your health care provider may also give you more specific instructions. If you have problems or questions, contact your health care provider. What can I expect after the procedure? After the procedure, the following side effects are common:  Pain or discomfort at the IV site.  Nausea.  Vomiting.  Sore throat.  Trouble concentrating.  Feeling cold or chills.  Weak or tired.  Sleepiness and fatigue.  Soreness and body aches. These side effects can affect parts of the body that were not involved in surgery. Follow these instructions at home:  For at least 24 hours after the procedure:  Have a responsible adult stay with you. It is important to have someone help care for you until you are awake and alert.  Rest as needed.  Do not: ? Participate in activities in which you could fall or become injured. ? Drive. ? Use heavy machinery. ? Drink alcohol. ? Take sleeping pills or medicines that cause drowsiness. ? Make important decisions or sign legal documents. ? Take care of children on your own. Eating and drinking  Follow any instructions from your health care provider about eating or drinking restrictions.  When you feel hungry, start by eating small amounts of foods that are soft and easy to digest (bland), such as toast. Gradually return to your regular diet.  Drink enough fluid to keep your urine pale yellow.  If you vomit, rehydrate by drinking water, juice, or clear broth. General instructions  If you have sleep apnea, surgery and certain medicines can increase your risk for breathing problems. Follow instructions from your health care provider about wearing your sleep device: ? Anytime you are sleeping, including during daytime naps. ? While taking prescription pain medicines, sleeping medicines, or medicines that make you drowsy.  Return  to your normal activities as told by your health care provider. Ask your health care provider  what activities are safe for you.  Take over-the-counter and prescription medicines only as told by your health care provider.  If you smoke, do not smoke without supervision.  Keep all follow-up visits as told by your health care provider. This is important. Contact a health care provider if:  You have nausea or vomiting that does not get better with medicine.  You cannot eat or drink without vomiting.  You have pain that does not get better with medicine.  You are unable to pass urine.  You develop a skin rash.  You have a fever.  You have redness around your IV site that gets worse. Get help right away if:  You have difficulty breathing.  You have chest pain.  You have blood in your urine or stool, or you vomit blood. Summary  After the procedure, it is common to have a sore throat or nausea. It is also common to feel tired.  Have a responsible adult stay with you for the first 24 hours after general anesthesia. It is important to have someone help care for you until you are awake and alert.  When you feel hungry, start by eating small amounts of foods that are soft and easy to digest (bland), such as toast. Gradually return to your regular diet.  Drink enough fluid to keep your urine pale yellow.  Return to your normal activities as told by your health care provider. Ask your health care provider what activities are safe for you. This information is not intended to replace advice given to you by your health care provider. Make sure you discuss any questions you have with your health care provider. Document Revised: 05/06/2017 Document Reviewed: 12/17/2016 Elsevier Patient Education  [place].

## 2019-06-21 NOTE — Interval H&P Note (Signed)
History and Physical Interval Note:  06/21/2019 7:20 AM  Diane [name]  has presented today for surgery, with the diagnosis of ENDOMETRIAL CANCER.  The various methods of treatment have been discussed with the patient and family. After consideration of risks, benefits and other options for treatment, the patient has consented to  Procedure(s): XI ROBOTIC ASSISTED TOTAL HYSTERECTOMY WITH BILATERAL SALPINGO OOPHORECTOMY (Bilateral) SENTINEL NODE BIOPSY (N/A) as a surgical intervention.  She has an abdominal wall mass on the left mid abdomen which we will evaluate intraoperatively. The patient's history has been reviewed, patient examined, no change in status, stable for surgery.  I have reviewed the patient's chart and labs.  Questions were answered to the patient's satisfaction.     Thereasa Solo

## 2019-06-21 NOTE — Anesthesia Preprocedure Evaluation (Addendum)
Anesthesia Evaluation  Patient identified by MRN, date of birth, ID band Patient awake    Reviewed: Allergy & Precautions, NPO status , Patient's Chart, lab work & pertinent test results  History of Anesthesia Complications Negative for: history of anesthetic complications  Airway Mallampati: I  TM Distance: >3 FB Neck ROM: Full    Dental  (+) Teeth Intact, Dental Advisory Given, Implants,    Pulmonary neg pulmonary ROS,    Pulmonary exam normal breath sounds clear to auscultation       Cardiovascular Exercise Tolerance: Good (-) hypertension(-) angina(-) Past MI negative cardio ROS Normal cardiovascular exam Rhythm:Regular Rate:Normal     Neuro/Psych negative neurological ROS  negative psych ROS   GI/Hepatic negative GI ROS, Neg liver ROS,   Endo/Other  Hypothyroidism   Renal/GU negative Renal ROS     Musculoskeletal negative musculoskeletal ROS (+)   Abdominal   Peds  Hematology negative hematology ROS (+)   Anesthesia Other Findings Day of surgery medications reviewed with the patient.  Reproductive/Obstetrics Endometrial cancer                             Anesthesia Physical Anesthesia Plan  ASA: II  Anesthesia Plan: General   Post-op Pain Management:    Induction: Intravenous  PONV Risk Score and Plan: 4 or greater and Diphenhydramine, Midazolam, Dexamethasone, Ondansetron and Scopolamine patch - Pre-op  Airway Management Planned: Oral ETT  Additional Equipment:   Intra-op Plan:   Post-operative Plan: Extubation in OR  Informed Consent: I have reviewed the patients History and Physical, chart, labs and discussed the procedure including the risks, benefits and alternatives for the proposed anesthesia with the patient or authorized representative who has indicated his/her understanding and acceptance.     Dental advisory given  Plan Discussed with:  CRNA  Anesthesia Plan Comments:        Anesthesia Quick Evaluation

## 2019-06-21 NOTE — Anesthesia Postprocedure Evaluation (Signed)
Anesthesia Post Note  Patient: TALITA MATIN  Procedure(s) Performed: XI ROBOTIC ASSISTED TOTAL HYSTERECTOMY WITH BILATERAL SALPINGO OOPHORECTOMY, EXCISION OF LEFT ABDOMINAL WALL MASS (Bilateral ) SENTINEL NODE BIOPSY (N/A )     Patient location during evaluation: PACU Anesthesia Type: General Level of consciousness: awake and alert Pain management: pain level controlled Vital Signs Assessment: post-procedure vital signs reviewed and stable Respiratory status: spontaneous breathing, nonlabored ventilation and respiratory function stable Cardiovascular status: blood pressure returned to baseline and stable Postop Assessment: no apparent nausea or vomiting Anesthetic complications: no    Last Vitals:  Vitals:   06/21/19 1230 06/21/19 1233  BP: 122/80 118/81  Pulse: 68 (!) 57  Resp: 15 14  Temp:  36.5 C  SpO2: 95% 97%    Last Pain:  Vitals:   06/21/19 1233  TempSrc:   PainSc: 5                  Catalina Gravel

## 2019-06-21 NOTE — Transfer of Care (Signed)
Immediate Anesthesia Transfer of Care Note  Patient: Diane [name]  Procedure(s) Performed: XI ROBOTIC ASSISTED TOTAL HYSTERECTOMY WITH BILATERAL SALPINGO OOPHORECTOMY, EXCISION OF LEFT ABDOMINAL WALL MASS (Bilateral ) SENTINEL NODE BIOPSY (N/A )  Patient Location: PACU  Anesthesia Type:General  Level of Consciousness: awake and drowsy  Airway & Oxygen Therapy: Patient Spontanous Breathing  Post-op Assessment: Report given to RN  Post vital signs: stable  Last Vitals:  Vitals Value Taken Time  BP 124/88 06/21/19 1108  Temp    Pulse 56 06/21/19 1111  Resp 13 06/21/19 1111  SpO2 100 % 06/21/19 1111  Vitals shown include unvalidated device data.  Last Pain:  Vitals:   06/21/19 0647  TempSrc:   PainSc: 0-No pain         Complications: No apparent anesthesia complications

## 2019-06-21 NOTE — CV Procedure (Deleted)
OPERATIVE NOTE 06/21/19  Surgeon: Donaciano Eva   Assistants: Dr Lahoma Crocker (an MD assistant was necessary for tissue manipulation, management of robotic instrumentation, retraction and positioning due to the complexity of the case and hospital policies).   Anesthesia: General endotracheal anesthesia  ASA Class: 3   Pre-operative Diagnosis: endometrial cancer grade 3, left abdominal wall mass  Post-operative Diagnosis: same, left abdominal sebaceous cyst  Operation: Robotic-assisted laparoscopic total hysterectomy with bilateral salpingoophorectomy, SLN biopsy, resection of abdominal wall mass   Surgeon: Donaciano Eva  Assistant Surgeon: Lahoma Crocker MD  Anesthesia: GET  Urine Output: 240  Operative Findings:  : 6cm uterus, normal appearing tubes and ovaries, no suspicious nodes.  Estimated Blood Loss:  <10cc      Total IV Fluids: 700 ml         Specimens: uterus, cervix, bilateral tubes and ovaries, right and left obturator SLN, left abdominal wall mass         Complications:  None; patient tolerated the procedure well.         Disposition: PACU - hemodynamically stable.  Procedure Details  The patient was seen in the Holding Room. The risks, benefits, complications, treatment options, and expected outcomes were discussed with the patient.  The patient concurred with the proposed plan, giving informed consent.  The site of surgery properly noted/marked. The patient was identified as Espyn Dyes and the procedure verified as a Robotic-assisted hysterectomy with bilateral salpingo oophorectomy with SLN biopsy. A Time Out was held and the above information confirmed.  After induction of anesthesia, the patient was draped and prepped in the usual sterile manner. Pt was placed in supine position after anesthesia and draped and prepped in the usual sterile manner. The abdominal drape was placed after the CholoraPrep had been allowed to dry for 3 minutes.   Her arms were tucked to her side with all appropriate precautions.  The shoulders were stabilized with padded shoulder blocks applied to the acromium processes.  The patient was placed in the semi-lithotomy position in [place].  The perineum was prepped with Betadine. The patient was then prepped. Foley catheter was placed.  A sterile speculum was placed in the vagina.  The cervix was grasped with a single-tooth tenaculum. 2mg  total of ICG was injected into the cervical stroma at 2 and 9 o'clock with 1cc injected at a 1cm and 35mm depth (concentration 0.5mg /ml) in all locations. The cervix was dilated with Kennon Rounds dilators.  The ZUMI uterine manipulator with a medium colpotomizer ring was placed without difficulty.  A pneum occluder balloon was placed over the manipulator.  OG tube placement was confirmed and to suction.   Next, a 5 mm skin incision was made 1 cm below the subcostal margin in the midclavicular line.  The 5 mm Optiview port and scope was used for direct entry.  Opening pressure was under 10 mm CO2.  The abdomen was insufflated and the findings were noted as above.   At this point and all points during the procedure, the patient's intra-abdominal pressure did not exceed 15 mmHg. Next, a 10 mm skin incision was made in the umbilicus and a right and left port was placed about 10 cm lateral to the robot port on the right and left side.  A fourth arm was placed in the left lower quadrant 2 cm above and superior and medial to the anterior superior iliac spine.  All ports were placed under direct visualization.  The patient was placed in  steep Trendelenburg.  Bowel was folded away into the upper abdomen.  The robot was docked in the normal manner.  The right and left peritoneum were opened parallel to the IP ligament to open the retroperitoneal spaces bilaterally. The SLN mapping was performed in bilateral pelvic basins. The para rectal and paravesical spaces were opened up entirely with careful  dissection below the level of the ureters bilaterally and to the depth of the uterine artery origin in order to skeletonize the uterine "web" and ensure visualization of all parametrial channels. The para-aortic basins were carefully exposed and evaluated for isolated para-aortic SLN's. Lymphatic channels were identified travelling to the following visualized sentinel lymph node's: right and left obturator SLN. These SLN's were separated from their surrounding lymphatic tissue, removed and sent for permanent pathology.  The hysterectomy was started after the round ligament on the right side was incised and the retroperitoneum was entered and the pararectal space was developed.  The ureter was noted to be on the medial leaf of the broad ligament.  The peritoneum above the ureter was incised and stretched and the infundibulopelvic ligament was skeletonized, cauterized and cut.  The posterior peritoneum was taken down to the level of the KOH ring.  The anterior peritoneum was also taken down.  The bladder flap was created to the level of the KOH ring.  The uterine artery on the right side was skeletonized, cauterized and cut in the normal manner.  A similar procedure was performed on the left.  The colpotomy was made and the uterus, cervix, bilateral ovaries and tubes were amputated and delivered through the vagina.  Pedicles were inspected and excellent hemostasis was achieved.    The colpotomy at the vaginal cuff was closed with Vicryl on a CT1 needle in a running manner.  Irrigation was used and excellent hemostasis was achieved.  At this point in the procedure was completed.  Robotic instruments were removed under direct visulaization.  The robot was undocked.   The left lateral port site incision was used to resect the left abdominal wall sebaceous cyst using the bovie. The 10 mm ports were closed with Vicryl on a UR-5 needle and the fascia was closed with 0 Vicryl on a UR-5 needle.  The skin was closed with  4-0 Vicryl in a subcuticular manner.  Dermabond was applied.  Sponge, lap and needle counts correct x 2.  The patient was taken to the recovery room in stable condition.  The vagina was swabbed with  minimal bleeding noted.   All instrument and needle counts were correct x  3.   The patient was transferred to the recovery room in a stable condition.  Donaciano Eva, MD

## 2019-06-22 ENCOUNTER — Telehealth: Payer: Self-pay

## 2019-06-22 NOTE — Telephone Encounter (Signed)
Diane [name] states that she is doing very well today. She is eating, drinking and urinating well. She is passing gas. Afebrile. Incisions are D&I. She is using the Ibuprofen for pain. She will add tylenol 500 - 1000 mg q 6 hrs if needed. She has not needed the tramadol for pain. She states it is more discomfort then pain. L side of abdomen  > right. She is aware of her post op appointments and the office number [phone] to call if she has any questions or concerns.

## 2019-06-22 NOTE — Op Note (Signed)
OPERATIVE NOTE 06/21/19  Surgeon: Donaciano Eva   Assistants: Dr Lahoma Crocker (an MD assistant was necessary for tissue manipulation, management of robotic instrumentation, retraction and positioning due to the complexity of the case and hospital policies).   Anesthesia: General endotracheal anesthesia  ASA Class: 3   Pre-operative Diagnosis: endometrial cancer grade 3, left abdominal wall mass  Post-operative Diagnosis: same, left abdominal sebaceous cyst  Operation: Robotic-assisted laparoscopic total hysterectomy with bilateral salpingoophorectomy, SLN biopsy, resection of abdominal wall mass   Surgeon: Donaciano Eva  Assistant Surgeon: Lahoma Crocker MD  Anesthesia: GET  Urine Output: 240  Operative Findings:  : 6cm uterus, normal appearing tubes and ovaries, no suspicious nodes.  Estimated Blood Loss:  <10cc      Total IV Fluids: 700 ml         Specimens: uterus, cervix, bilateral tubes and ovaries, right and left obturator SLN, left abdominal wall mass         Complications:  None; patient tolerated the procedure well.         Disposition: PACU - hemodynamically stable.  Procedure Details  The patient was seen in the Holding Room. The risks, benefits, complications, treatment options, and expected outcomes were discussed with the patient.  The patient concurred with the proposed plan, giving informed consent.  The site of surgery properly noted/marked. The patient was identified as Diane [name] and the procedure verified as a Robotic-assisted hysterectomy with bilateral salpingo oophorectomy with SLN biopsy. A Time Out was held and the above information confirmed.  After induction of anesthesia, the patient was draped and prepped in the usual sterile manner. Pt was placed in supine position after anesthesia and draped and prepped in the usual sterile manner. The abdominal drape was placed after the CholoraPrep had been allowed to dry  for 3 minutes.  Her arms were tucked to her side with all appropriate precautions.  The shoulders were stabilized with padded shoulder blocks applied to the acromium processes.  The patient was placed in the semi-lithotomy position in [place].  The perineum was prepped with Betadine. The patient was then prepped. Foley catheter was placed.  A sterile speculum was placed in the vagina.  The cervix was grasped with a single-tooth tenaculum. 2mg  total of ICG was injected into the cervical stroma at 2 and 9 o'clock with 1cc injected at a 1cm and 85mm depth (concentration 0.5mg /ml) in all locations. The cervix was dilated with Kennon Rounds dilators.  The ZUMI uterine manipulator with a medium colpotomizer ring was placed without difficulty.  A pneum occluder balloon was placed over the manipulator.  OG tube placement was confirmed and to suction.   Next, a 5 mm skin incision was made 1 cm below the subcostal margin in the midclavicular line.  The 5 mm Optiview port and scope was used for direct entry.  Opening pressure was under 10 mm CO2.  The abdomen was insufflated and the findings were noted as above.   At this point and all points during the procedure, the patient's intra-abdominal pressure did not exceed 15 mmHg. Next, a 10 mm skin incision was made in the umbilicus and a right and left port was placed about 10 cm lateral to the robot port on the right and left side.  A fourth arm was placed in the left lower quadrant 2 cm above and superior and medial to the anterior superior iliac spine.  All ports were placed under direct visualization.  The patient was placed in  steep Trendelenburg.  Bowel was folded away into the upper abdomen.  The robot was docked in the normal manner.  The right and left peritoneum were opened parallel to the IP ligament to open the retroperitoneal spaces bilaterally. The SLN mapping was performed in bilateral pelvic basins. The para rectal and paravesical spaces were opened up  entirely with careful dissection below the level of the ureters bilaterally and to the depth of the uterine artery origin in order to skeletonize the uterine "web" and ensure visualization of all parametrial channels. The para-aortic basins were carefully exposed and evaluated for isolated para-aortic SLN's. Lymphatic channels were identified travelling to the following visualized sentinel lymph node's: right and left obturator SLN. These SLN's were separated from their surrounding lymphatic tissue, removed and sent for permanent pathology.  The hysterectomy was started after the round ligament on the right side was incised and the retroperitoneum was entered and the pararectal space was developed.  The ureter was noted to be on the medial leaf of the broad ligament.  The peritoneum above the ureter was incised and stretched and the infundibulopelvic ligament was skeletonized, cauterized and cut.  The posterior peritoneum was taken down to the level of the KOH ring.  The anterior peritoneum was also taken down.  The bladder flap was created to the level of the KOH ring.  The uterine artery on the right side was skeletonized, cauterized and cut in the normal manner.  A similar procedure was performed on the left.  The colpotomy was made and the uterus, cervix, bilateral ovaries and tubes were amputated and delivered through the vagina.  Pedicles were inspected and excellent hemostasis was achieved.    The colpotomy at the vaginal cuff was closed with Vicryl on a CT1 needle in a running manner.  Irrigation was used and excellent hemostasis was achieved.  At this point in the procedure was completed.  Robotic instruments were removed under direct visulaization.  The robot was undocked.   The left lateral port site incision was used to resect the left abdominal wall sebaceous cyst using the bovie. The 10 mm ports were closed with Vicryl on a UR-5 needle and the fascia was closed with 0 Vicryl on a UR-5 needle.   The skin was closed with 4-0 Vicryl in a subcuticular manner.  Dermabond was applied.  Sponge, lap and needle counts correct x 2.  The patient was taken to the recovery room in stable condition.  The vagina was swabbed with  minimal bleeding noted.   All instrument and needle counts were correct x  3.   The patient was transferred to the recovery room in a stable condition.  Donaciano Eva, MD        Electronically signed by Everitt Amber, MD at 06/21/2019 11:20 AM

## 2019-06-26 ENCOUNTER — Encounter: Payer: Self-pay | Admitting: Gynecologic Oncology

## 2019-06-26 ENCOUNTER — Inpatient Hospital Stay: Payer: Medicare HMO | Attending: Gynecologic Oncology | Admitting: Gynecologic Oncology

## 2019-06-26 DIAGNOSIS — C541 Malignant neoplasm of endometrium: Secondary | ICD-10-CM | POA: Insufficient documentation

## 2019-06-26 DIAGNOSIS — Z90722 Acquired absence of ovaries, bilateral: Secondary | ICD-10-CM | POA: Insufficient documentation

## 2019-06-26 DIAGNOSIS — Z90710 Acquired absence of both cervix and uterus: Secondary | ICD-10-CM | POA: Insufficient documentation

## 2019-06-26 DIAGNOSIS — Z7189 Other specified counseling: Secondary | ICD-10-CM

## 2019-06-26 NOTE — Progress Notes (Signed)
Gynecologic Oncology Telehealth Consult Note: Gyn-Onc  I connected with Diane [name] on 06/26/19 at  3:30 PM EST by telephone and verified that I am speaking with the correct person using two identifiers.  I discussed the limitations, risks, security and privacy concerns of performing an evaluation and management service by telemedicine and the availability of in-person appointments. I also discussed with the patient that there may be a patient responsible charge related to this service. The patient expressed understanding and agreed to proceed.  Other persons participating in the visit and their role in the encounter: none.  Patient's location: home Provider's location: [facility]  Chief Complaint:  Chief Complaint  Patient presents with  . endometrial cancer    Assessment/Plan:  [name]  is a 69 y.o.  year old with stage IA grade 3 endometrioid cancer (MSI pending).s/p staging on 06/21/19.  Pathology revealed low risk factors for recurrence, therefore no adjuvant therapy is recommended according to NCCN guidelines.  I discussed risk for recurrence and typical symptoms encouraged her to notify us of these should they develop between visits.  I recommend she have follow-up every 6 months for 5 years in accordance with NCCN guidelines. Those visits should include symptom assessment, physical exam and pelvic examination. Pap smears are not indicated or recommended in the routine surveillance of endometrial cancer.  HPI: [name] is a 69 year old P3 who was seen in consultation at the request of Dr. Ihor Dow for evaluation of grade 3 endometrial cancer.  The patient reported that she began experiencing postmenopausal bleeding in late November 2020.  She was seen by her primary care physician who acknowledged that this was uterine bleeding and referred her to her gynecologist.  She underwent a transvaginal ultrasound scan on June 06, 2019 which revealed a  uterus measuring 6.1 x 3.7 x 5.1 cm with a 22 mm endometrial thickness.  Right and left ovaries were normal.  She then underwent a transvaginal endometrial [place] biopsy on June 14, 2019.  This revealed high-grade endometrial adenocarcinoma with a differential diagnosis including endometrioid or serous carcinoma.  Pap test was normal with negative high-risk HPV performed on October 30, 2018.  The patient underwent a CT scan of the abdomen and pelvis with IV contrast on June 14, 2019 which revealed a heterogeneous 2.4 cm mass in the fundus of the uterus but no evidence of metastatic disease.  There was some aortic atherosclerosis but no additional findings that were significant.  The patient is a remarkably healthy woman.  She had no history of diabetes, hypertension, hormone replacement therapy use.  She has had 3 prior vaginal deliveries and no prior surgeries.  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  She lives with her husband.  Her family history is remarkable for mother with breast cancer in her late 35s treated with surgery alone, and a brother who had throat cancer.  Interval Hx:  On 06/26/19 she underwent a robotic assisted total hysterectomy, BSO, SLN biopsy. Intraoperative findings were significant for a small normal sized uterus and a left abdominal sebaceous cyst. Surgery was uncomplicated.  Final pathology revealed a FIGO grade 3 stage IA endometrioid endometrial cancer. There was inner half myo invasion (2 of 77m). There was no LVSI, no cervical/adnexal/nodal inolvement. She met low risk factors for recurrence and therefore no adjuvant therapy was recommended in accordance with NCCN guidelines.  Since surgery she has done well with no complaints.  Current Meds:  Outpatient Encounter Medications as of 06/26/2019  Medication Sig  . b complex vitamins capsule Take 1 capsule by mouth every evening.   . calcium-vitamin D (OSCAL  WITH D) 500-200 MG-UNIT tablet Take 1 tablet by mouth every evening.   . ibuprofen (ADVIL) 800 MG tablet Take 1 tablet (800 mg total) by mouth every 8 (eight) hours as needed for moderate pain. For AFTER surgery  . metroNIDAZOLE (METROCREAM) 0.75 % cream Apply 1 application topically every evening.  . Multiple Vitamin (MULTIVITAMIN WITH MINERALS) TABS tablet Take 1 tablet by mouth every evening.  . senna-docusate (SENOKOT-S) 8.6-50 MG tablet Take 2 tablets by mouth at bedtime. For AFTER surgery, do not take if having diarrhea  . Sulfacetamide Sodium (SODIUM SULFACETAMIDE EX) Apply 1 application topically every evening.  . SYNTHROID 125 MCG tablet Take 1 tablet (125 mcg total) by mouth daily.  . traMADol (ULTRAM) 50 MG tablet Take 1 tablet (50 mg total) by mouth every 6 (six) hours as needed for severe pain. For AFTER surgery, do not take and drive   No facility-administered encounter medications on file as of 06/26/2019.    Allergy: No Known Allergies  Social Hx:   Social History   Socioeconomic History  . Marital status: Married    Spouse name: Not on file  . Number of children: Not on file  . Years of education: Not on file  . Highest education level: Not on file  Occupational History  . Not on file  Tobacco Use  . Smoking status: Never Smoker  . Smokeless tobacco: Never Used  Substance and Sexual Activity  . Alcohol use: No  . Drug use: No  . Sexual activity: Yes    Birth control/protection: None  Other Topics Concern  . Not on file  Social History Narrative  . Not on file   Social Determinants of Health   Financial Resource Strain:   . Difficulty of Paying Living Expenses: Not on file  Food Insecurity:   . Worried About Running Out of Food in the Last Year: Not on file  . Ran Out of Food in the Last Year: Not on file  Transportation Needs:   . Lack of Transportation (Medical): Not on file  . Lack of Transportation (Non-Medical): Not on file  Physical Activity:   .  Days of Exercise per Week: Not on file  . Minutes of Exercise per Session: Not on file  Stress:   . Feeling of Stress : Not on file  Social Connections:   . Frequency of Communication with Friends and Family: Not on file  . Frequency of Social Gatherings with Friends and Family: Not on file  . Attends Religious Services: Not on file  . Active Member of Clubs or Organizations: Not on file  . Attends Club or Organization Meetings: Not on file  . Marital Status: Not on file  Intimate Partner Violence:   . Fear of Current or Ex-Partner: Not on file  . Emotionally Abused: Not on file  . Physically Abused: Not on file  . Sexually Abused: Not on file    Past Surgical Hx:  Past Surgical History:  Procedure Laterality Date  . COLONOSCOPY  2007   Dr. Jacobs  . ROBOTIC ASSISTED TOTAL HYSTERECTOMY WITH BILATERAL SALPINGO OOPHERECTOMY Bilateral 06/21/2019   Procedure: XI ROBOTIC ASSISTED TOTAL HYSTERECTOMY WITH BILATERAL SALPINGO OOPHORECTOMY, EXCISION OF LEFT ABDOMINAL WALL MASS;  Surgeon: Rossi, Emma, MD;  Location: [facility];  Service: Gynecology;  Laterality: Bilateral;  .   SENTINEL NODE BIOPSY N/A 06/21/2019   Procedure: SENTINEL NODE BIOPSY;  Surgeon: Everitt Amber, MD;  Location: [facility];  Service: Gynecology;  Laterality: N/A;    Past Medical Hx:  Past Medical History:  Diagnosis Date  . Endometrial cancer ([place])   . Hypothyroid   . Personal history of colonic polyps   . Postmenopausal bleeding   . Uterine cancer [facility])     Past Gynecological History:  See HPI No LMP recorded. Patient is postmenopausal.  Family Hx:  Family History  Problem Relation Age of Onset  . Cancer Mother 49       Breast cancer  . Other Mother        polymyositis?  Marland Kitchen COPD Father   . Cancer Brother        Throat cancer  . Colon cancer Neg Hx   . Endometrial cancer Neg Hx   . Ovarian cancer Neg Hx     Review of Systems:  Constitutional  Feels well,    ENT Normal appearing ears and nares  bilaterally Skin/Breast  No rash, sores, jaundice, itching, dryness Cardiovascular  No chest pain, shortness of breath, or edema  Pulmonary  No cough or wheeze.  Gastro Intestinal  No nausea, vomitting, or diarrhoea. No bright red blood per rectum, no abdominal pain, change in bowel movement, or constipation.  Genito Urinary  No frequency, urgency, dysuria, + postmenopausal bleeding Musculo Skeletal  No myalgia, arthralgia, joint swelling or pain  Neurologic  No weakness, numbness, change in gait,  Psychology  No depression, anxiety, insomnia.   Vitals:  There were no vitals taken for this visit.  Physical Exam: Deferred  I discussed the assessment and treatment plan with the patient. The patient was provided with an opportunity to ask questions and all were answered. The patient agreed with the plan and demonstrated an understanding of the instructions.   The patient was advised to call back or see an in-person evaluation if the symptoms worsen or if the condition fails to improve as anticipated.   I provided 15 minutes of non face-to-face telephone visit time during this encounter, and > 50% was spent counseling as documented under my assessment & plan.    Thereasa Solo, MD  06/26/2019, 3:35 PM

## 2019-06-27 LAB — SURGICAL PATHOLOGY

## 2019-07-12 ENCOUNTER — Inpatient Hospital Stay (HOSPITAL_BASED_OUTPATIENT_CLINIC_OR_DEPARTMENT_OTHER): Payer: Medicare HMO | Admitting: Gynecologic Oncology

## 2019-07-12 ENCOUNTER — Other Ambulatory Visit: Payer: Self-pay

## 2019-07-12 VITALS — BP 106/75 | HR 67 | Temp 98.5°F | Resp 17 | Ht 68.0 in | Wt 114.6 lb

## 2019-07-12 DIAGNOSIS — Z90722 Acquired absence of ovaries, bilateral: Secondary | ICD-10-CM | POA: Diagnosis not present

## 2019-07-12 DIAGNOSIS — C541 Malignant neoplasm of endometrium: Secondary | ICD-10-CM | POA: Diagnosis not present

## 2019-07-12 DIAGNOSIS — Z90710 Acquired absence of both cervix and uterus: Secondary | ICD-10-CM | POA: Diagnosis not present

## 2019-07-12 DIAGNOSIS — Z7189 Other specified counseling: Secondary | ICD-10-CM

## 2019-07-12 NOTE — Progress Notes (Signed)
Gynecologic Oncology Follow-up Visit  Chief Complaint:  Chief Complaint  Patient presents with  . Endometrial carcinoma ([place])    Follow up    Assessment/Plan:  [name]  is a 69 y.o.  year old with stage IA grade 3 endometrioid cancer (MSI stable/MMR proficient).s/p staging on 06/21/19.  Pathology revealed low risk factors for recurrence, therefore no adjuvant therapy is recommended according to NCCN guidelines.  I discussed risk for recurrence and typical symptoms encouraged her to notify us of these should they develop between visits.  I recommend she have follow-up every 6 months for 5 years in accordance with NCCN guidelines. Those visits should include symptom assessment, physical exam and pelvic examination. Pap smears are not indicated or recommended in the routine surveillance of endometrial cancer.  Survivorship care plan will be issued at next visit.  HPI: [name] is a 69 year old P3 who was seen in consultation at the request of Dr. Ihor Dow for evaluation of grade 3 endometrial cancer.  The patient reported that she began experiencing postmenopausal bleeding in late November 2020.  She was seen by her primary care physician who acknowledged that this was uterine bleeding and referred her to her gynecologist.  She underwent a transvaginal ultrasound scan on June 06, 2019 which revealed a uterus measuring 6.1 x 3.7 x 5.1 cm with a 22 mm endometrial thickness.  Right and left ovaries were normal.  She then underwent a transvaginal endometrial Pipelle biopsy on June 14, 2019.  This revealed high-grade endometrial adenocarcinoma with a differential diagnosis including endometrioid or serous carcinoma.  Pap test was normal with negative high-risk HPV performed on October 30, 2018.  The patient underwent a CT scan of the abdomen and pelvis with IV contrast on June 14, 2019 which revealed a heterogeneous 2.4 cm mass in the fundus of the uterus but no  evidence of metastatic disease.  There was some aortic atherosclerosis but no additional findings that were significant.  The patient is a remarkably healthy woman.  She had no history of diabetes, hypertension, hormone replacement therapy use.  She has had 3 prior vaginal deliveries and no prior surgeries.  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  She lives with her husband.  Her family history is remarkable for mother with breast cancer in her late 27s treated with surgery alone, and a brother who had throat cancer.  Interval Hx:  On 06/26/19 she underwent a robotic assisted total hysterectomy, BSO, SLN biopsy. Intraoperative findings were significant for a small normal sized uterus and a left abdominal sebaceous cyst. Surgery was uncomplicated.  Final pathology revealed a FIGO grade 3 stage IA endometrioid endometrial cancer. There was inner half myo invasion (2 of 15m). There was no LVSI, no cervical/adnexal/nodal inolvement. The tumor was MSI stable/MMR proficient. She met low risk factors for recurrence and therefore no adjuvant therapy was recommended in accordance with NCCN guidelines.  Since surgery she has done well with no complaints.   Current Meds:  Outpatient Encounter Medications as of 07/12/2019  Medication Sig  . b complex vitamins capsule Take 1 capsule by mouth every evening.   . calcium-vitamin D (OSCAL WITH D) 500-200 MG-UNIT tablet Take 1 tablet by mouth every evening.   .Marland Kitchenibuprofen (ADVIL) 800 MG tablet Take 1 tablet (800 mg total) by mouth every 8 (eight) hours as needed for moderate pain. For AFTER surgery  . metroNIDAZOLE (METROCREAM) 0.75 % cream Apply 1  application topically every evening.  . Multiple Vitamin (MULTIVITAMIN WITH MINERALS) TABS tablet Take 1 tablet by mouth every evening.  . senna-docusate (SENOKOT-S) 8.6-50 MG tablet Take 2 tablets by mouth at bedtime. For AFTER surgery, do not take if having diarrhea   . Sulfacetamide Sodium (SODIUM SULFACETAMIDE EX) Apply 1 application topically every evening.  Marland Kitchen SYNTHROID 125 MCG tablet Take 1 tablet (125 mcg total) by mouth daily.  . [DISCONTINUED] traMADol (ULTRAM) 50 MG tablet Take 1 tablet (50 mg total) by mouth every 6 (six) hours as needed for severe pain. For AFTER surgery, do not take and drive (Patient not taking: Reported on 07/12/2019)   No facility-administered encounter medications on file as of 07/12/2019.    Allergy: No Known Allergies  Social Hx:   Social History   Socioeconomic History  . Marital status: Married    Spouse name: Not on file  . Number of children: Not on file  . Years of education: Not on file  . Highest education level: Not on file  Occupational History  . Not on file  Tobacco Use  . Smoking status: Never Smoker  . Smokeless tobacco: Never Used  Substance and Sexual Activity  . Alcohol use: No  . Drug use: No  . Sexual activity: Yes    Birth control/protection: None  Other Topics Concern  . Not on file  Social History Narrative  . Not on file   Social Determinants of Health   Financial Resource Strain:   . Difficulty of Paying Living Expenses: Not on file  Food Insecurity:   . Worried About Charity fundraiser in the Last Year: Not on file  . Ran Out of Food in the Last Year: Not on file  Transportation Needs:   . Lack of Transportation (Medical): Not on file  . Lack of Transportation (Non-Medical): Not on file  Physical Activity:   . Days of Exercise per Week: Not on file  . Minutes of Exercise per Session: Not on file  Stress:   . Feeling of Stress : Not on file  Social Connections:   . Frequency of Communication with Friends and Family: Not on file  . Frequency of Social Gatherings with Friends and Family: Not on file  . Attends Religious Services: Not on file  . Active Member of Clubs or Organizations: Not on file  . Attends Archivist Meetings: Not on file  . Marital Status: Not  on file  Intimate Partner Violence:   . Fear of Current or Ex-Partner: Not on file  . Emotionally Abused: Not on file  . Physically Abused: Not on file  . Sexually Abused: Not on file    Past Surgical Hx:  Past Surgical History:  Procedure Laterality Date  . COLONOSCOPY  2007   Dr. Ardis Hughs  . ROBOTIC ASSISTED TOTAL HYSTERECTOMY WITH BILATERAL SALPINGO OOPHERECTOMY Bilateral 06/21/2019   Procedure: XI ROBOTIC ASSISTED TOTAL HYSTERECTOMY WITH BILATERAL SALPINGO OOPHORECTOMY, EXCISION OF LEFT ABDOMINAL WALL MASS;  Surgeon: Everitt Amber, MD;  Location: [facility];  Service: Gynecology;  Laterality: Bilateral;  . SENTINEL NODE BIOPSY N/A 06/21/2019   Procedure: SENTINEL NODE BIOPSY;  Surgeon: Everitt Amber, MD;  Location: [facility];  Service: Gynecology;  Laterality: N/A;    Past Medical Hx:  Past Medical History:  Diagnosis Date  . Endometrial cancer ([place])   . Hypothyroid   . Personal history of colonic polyps   . Postmenopausal bleeding   . Uterine cancer ([facility])     Past  Gynecological History:  See HPI No LMP recorded. Patient is postmenopausal.  Family Hx:  Family History  Problem Relation Age of Onset  . Cancer Mother 23       Breast cancer  . Other Mother        polymyositis?  Marland Kitchen COPD Father   . Cancer Brother        Throat cancer  . Colon cancer Neg Hx   . Endometrial cancer Neg Hx   . Ovarian cancer Neg Hx     Review of Systems:  Constitutional  Feels well,    ENT Normal appearing ears and nares bilaterally Skin/Breast  No rash, sores, jaundice, itching, dryness Cardiovascular  No chest pain, shortness of breath, or edema  Pulmonary  No cough or wheeze.  Gastro Intestinal  No nausea, vomitting, or diarrhoea. No bright red blood per rectum, no abdominal pain, change in bowel movement, or constipation.  Genito Urinary  No frequency, urgency, dysuria, no bleeding Musculo Skeletal  No myalgia, arthralgia, joint swelling or pain  Neurologic  No weakness, numbness, change  in gait,  Psychology  No depression, anxiety, insomnia.   Vitals:  Blood pressure 106/75, pulse 67, temperature 98.5 F (36.9 C), temperature source Temporal, resp. rate 17, height 5' 8"  (1.727 m), weight 114 lb 9.6 oz (52 kg), SpO2 100 %.  Physical Exam: WD in NAD Neck  Supple NROM, without any enlargements.  Lymph Node Survey No cervical supraclavicular or inguinal adenopathy Cardiovascular  Pulse normal rate, regularity and rhythm. S1 and S2 normal.  Lungs  Clear to auscultation bilateraly, without wheezes/crackles/rhonchi. Good air movement.  Skin  No rash/lesions/breakdown  Psychiatry  Alert and oriented to person, place, and time  Abdomen  Normoactive bowel sounds, abdomen soft, non-tender and thin without evidence of hernia. Incisions healing normally. Back No CVA tenderness Genito Urinary  Vulva/vagina: Normal external female genitalia.  No lesions. No discharge or bleeding.  Bladder/urethra:  No lesions or masses, well supported bladder  Vagina: smooth, no lesions, cuff healing normally.  Cervix and uterus surgically absent  Adnexa: no palpable masses. Rectal  deferred Extremities  No bilateral cyanosis, clubbing or edema.   20 minutes of direct face to face counseling time was spent with the patient. This included discussion about prognosis, therapy recommendations and postoperative side effects and are beyond the scope of routine postoperative care.   Thereasa Solo, MD  07/12/2019, 3:13 PM

## 2019-07-12 NOTE — Patient Instructions (Signed)
Please notify Dr Denman George at phone number [phone] if you notice vaginal bleeding, new pelvic or abdominal pains, bloating, feeling full easy, or a change in bladder or bowel function.   Please contact Dr Serita Grit office (at [phone]) in April, 2021 to request an appointment with her for August, 2021.

## 2019-09-19 ENCOUNTER — Other Ambulatory Visit: Payer: Self-pay | Admitting: Family Medicine

## 2019-09-19 DIAGNOSIS — E039 Hypothyroidism, unspecified: Secondary | ICD-10-CM

## 2019-11-20 ENCOUNTER — Telehealth: Payer: Self-pay | Admitting: *Deleted

## 2019-11-20 NOTE — Telephone Encounter (Signed)
Patient called and scheduled a follow up appt for Aug

## 2019-12-21 ENCOUNTER — Other Ambulatory Visit: Payer: Self-pay

## 2019-12-21 ENCOUNTER — Encounter: Payer: Self-pay | Admitting: Gynecologic Oncology

## 2019-12-21 ENCOUNTER — Inpatient Hospital Stay: Payer: Medicare HMO | Attending: Gynecologic Oncology | Admitting: Gynecologic Oncology

## 2019-12-21 VITALS — BP 120/76 | HR 62 | Temp 98.2°F | Resp 18 | Ht 68.0 in | Wt 118.0 lb

## 2019-12-21 DIAGNOSIS — L723 Sebaceous cyst: Secondary | ICD-10-CM | POA: Insufficient documentation

## 2019-12-21 DIAGNOSIS — Z90710 Acquired absence of both cervix and uterus: Secondary | ICD-10-CM | POA: Diagnosis not present

## 2019-12-21 DIAGNOSIS — Z90722 Acquired absence of ovaries, bilateral: Secondary | ICD-10-CM

## 2019-12-21 DIAGNOSIS — C541 Malignant neoplasm of endometrium: Secondary | ICD-10-CM | POA: Diagnosis not present

## 2019-12-21 NOTE — Progress Notes (Signed)
Gynecologic Oncology Follow-up Visit  Chief Complaint:  Chief Complaint  Patient presents with  . Endometrial carcinoma [facility])    Assessment/Plan:  [name]  is a 69 y.o.  year old with stage IA grade 3 endometrioid cancer (MSI stable/MMR proficient).s/p staging on 06/21/19.  Pathology revealed low risk factors for recurrence, therefore no adjuvant therapy is recommended according to NCCN guidelines.  I discussed risk for recurrence and typical symptoms encouraged her to notify us of these should they develop between visits.  I recommend she have follow-up every 6 months for 5 years in accordance with NCCN guidelines. Those visits should include symptom assessment, physical exam and pelvic examination. Pap smears are not indicated or recommended in the routine surveillance of endometrial cancer.  HPI: [name] is a 69 year old P3 who was seen in consultation at the request of Dr. Ihor Dow for evaluation of grade 3 endometrial cancer.  The patient reported that she began experiencing postmenopausal bleeding in late November 2020.  She was seen by her primary care physician who acknowledged that this was uterine bleeding and referred her to her gynecologist.  She underwent a transvaginal ultrasound scan on June 06, 2019 which revealed a uterus measuring 6.1 x 3.7 x 5.1 cm with a 22 mm endometrial thickness.  Right and left ovaries were normal.  She then underwent a transvaginal endometrial Pipelle biopsy on June 14, 2019.  This revealed high-grade endometrial adenocarcinoma with a differential diagnosis including endometrioid or serous carcinoma.  Pap test was normal with negative high-risk HPV performed on October 30, 2018.  The patient underwent a CT scan of the abdomen and pelvis with IV contrast on June 14, 2019 which revealed a heterogeneous 2.4 cm mass in the fundus of the uterus but no evidence of metastatic disease.  There was some aortic atherosclerosis but  no additional findings that were significant.  The patient is a remarkably healthy woman.  She had no history of diabetes, hypertension, hormone replacement therapy use.  She has had 3 prior vaginal deliveries and no prior surgeries.  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  She lives with her husband.  Her family history is remarkable for mother with breast cancer in her late 78s treated with surgery alone, and a brother who had throat cancer.  Interval Hx:  On 06/26/19 she underwent a robotic assisted total hysterectomy, BSO, SLN biopsy. Intraoperative findings were significant for a small normal sized uterus and a left abdominal sebaceous cyst. Surgery was uncomplicated.  Final pathology revealed a FIGO grade 3 stage IA endometrioid endometrial cancer. There was inner half myo invasion (2 of 9m). There was no LVSI, no cervical/adnexal/nodal inolvement. The tumor was MSI stable/MMR proficient. She met low risk factors for recurrence and therefore no adjuvant therapy was recommended in accordance with NCCN guidelines.  Since surgery she has done well with no complaints.   Current Meds:  Outpatient Encounter Medications as of 12/21/2019  Medication Sig  . b complex vitamins capsule Take 1 capsule by mouth every evening.   . calcium-vitamin D (OSCAL WITH D) 500-200 MG-UNIT tablet Take 1 tablet by mouth every evening.   . metroNIDAZOLE (METROCREAM) 0.75 % cream Apply 1 application topically every evening.  . Multiple Vitamin (MULTIVITAMIN WITH MINERALS) TABS tablet Take 1 tablet by mouth every evening.  . Sulfacetamide Sodium (SODIUM SULFACETAMIDE EX) Apply 1 application topically every evening.  .Marland KitchenSYNTHROID 125 MCG tablet TAKE 1 TABLET  BY MOUTH EVERY DAY  . [DISCONTINUED] ibuprofen (ADVIL) 800 MG tablet Take 1 tablet (800 mg total) by mouth every 8 (eight) hours as needed for moderate pain. For AFTER surgery  . [DISCONTINUED] senna-docusate  (SENOKOT-S) 8.6-50 MG tablet Take 2 tablets by mouth at bedtime. For AFTER surgery, do not take if having diarrhea   No facility-administered encounter medications on file as of 12/21/2019.    Allergy: No Known Allergies  Social Hx:   Social History   Socioeconomic History  . Marital status: Married    Spouse name: Not on file  . Number of children: Not on file  . Years of education: Not on file  . Highest education level: Not on file  Occupational History  . Not on file  Tobacco Use  . Smoking status: Never Smoker  . Smokeless tobacco: Never Used  Vaping Use  . Vaping Use: Never used  Substance and Sexual Activity  . Alcohol use: No  . Drug use: No  . Sexual activity: Yes    Birth control/protection: None  Other Topics Concern  . Not on file  Social History Narrative  . Not on file   Social Determinants of Health   Financial Resource Strain:   . Difficulty of Paying Living Expenses:   Food Insecurity:   . Worried About Charity fundraiser in the Last Year:   . Arboriculturist in the Last Year:   Transportation Needs:   . Film/video editor (Medical):   Marland Kitchen Lack of Transportation (Non-Medical):   Physical Activity:   . Days of Exercise per Week:   . Minutes of Exercise per Session:   Stress:   . Feeling of Stress :   Social Connections:   . Frequency of Communication with Friends and Family:   . Frequency of Social Gatherings with Friends and Family:   . Attends Religious Services:   . Active Member of Clubs or Organizations:   . Attends Archivist Meetings:   Marland Kitchen Marital Status:   Intimate Partner Violence:   . Fear of Current or Ex-Partner:   . Emotionally Abused:   Marland Kitchen Physically Abused:   . Sexually Abused:     Past Surgical Hx:  Past Surgical History:  Procedure Laterality Date  . COLONOSCOPY  2007   Dr. Ardis Hughs  . ROBOTIC ASSISTED TOTAL HYSTERECTOMY WITH BILATERAL SALPINGO OOPHERECTOMY Bilateral 06/21/2019   Procedure: XI ROBOTIC ASSISTED  TOTAL HYSTERECTOMY WITH BILATERAL SALPINGO OOPHORECTOMY, EXCISION OF LEFT ABDOMINAL WALL MASS;  Surgeon: Everitt Amber, MD;  Location: [facility];  Service: Gynecology;  Laterality: Bilateral;  . SENTINEL NODE BIOPSY N/A 06/21/2019   Procedure: SENTINEL NODE BIOPSY;  Surgeon: Everitt Amber, MD;  Location: [facility];  Service: Gynecology;  Laterality: N/A;    Past Medical Hx:  Past Medical History:  Diagnosis Date  . Endometrial cancer ([place])   . Hypothyroid   . Personal history of colonic polyps   . Postmenopausal bleeding   . Uterine cancer [facility])     Past Gynecological History:  See HPI No LMP recorded. Patient is postmenopausal.  Family Hx:  Family History  Problem Relation Age of Onset  . Cancer Mother 71       Breast cancer  . Other Mother        polymyositis?  Marland Kitchen COPD Father   . Cancer Brother        Throat cancer  . Colon cancer Neg Hx   . Endometrial cancer Neg Hx   .  Ovarian cancer Neg Hx     Review of Systems:  Constitutional  Feels well,    ENT Normal appearing ears and nares bilaterally Skin/Breast  No rash, sores, jaundice, itching, dryness Cardiovascular  No chest pain, shortness of breath, or edema  Pulmonary  No cough or wheeze.  Gastro Intestinal  No nausea, vomitting, or diarrhoea. No bright red blood per rectum, no abdominal pain, change in bowel movement, or constipation.  Genito Urinary  No frequency, urgency, dysuria, no bleeding Musculo Skeletal  No myalgia, arthralgia, joint swelling or pain  Neurologic  No weakness, numbness, change in gait,  Psychology  No depression, anxiety, insomnia.   Vitals:  Blood pressure 120/76, pulse 62, temperature 98.2 F (36.8 C), temperature source Oral, resp. rate 18, height _0  (1.727 m), weight 118 lb (53.5 kg), SpO2 100 %.  Physical Exam: WD in NAD Neck  Supple NROM, without any enlargements.  Lymph Node Survey No cervical supraclavicular or inguinal adenopathy Cardiovascular  Pulse normal rate, regularity  and rhythm. S1 and S2 normal.  Lungs  Clear to auscultation bilateraly, without wheezes/crackles/rhonchi. Good air movement.  Skin  No rash/lesions/breakdown  Psychiatry  Alert and oriented to person, place, and time  Abdomen  Normoactive bowel sounds, abdomen soft, non-tender and thin without evidence of hernia. Incisions healing normally. Back No CVA tenderness Genito Urinary  Vulva/vagina: Normal external female genitalia.  No lesions. No discharge or bleeding.  Bladder/urethra:  No lesions or masses, well supported bladder  Vagina: smooth, no lesions, cuff healing normally.  Cervix and uterus surgically absent  Adnexa: no palpable masses. Rectal  deferred Extremities  No bilateral cyanosis, clubbing or edema.  Thereasa Solo, MD  12/21/2019, 3:07 PM

## 2019-12-21 NOTE — Patient Instructions (Signed)
Please notify Dr Denman George at phone number [phone] if you notice vaginal bleeding, new pelvic or abdominal pains, bloating, feeling full easy, or a change in bladder or bowel function.   Please contact Dr Serita Grit office (at [phone]) in October to request an appointment with her for February, 2022.

## 2020-03-21 ENCOUNTER — Other Ambulatory Visit: Payer: Self-pay | Admitting: Family Medicine

## 2020-03-21 DIAGNOSIS — E039 Hypothyroidism, unspecified: Secondary | ICD-10-CM

## 2020-04-17 DIAGNOSIS — L718 Other rosacea: Secondary | ICD-10-CM | POA: Diagnosis not present

## 2020-04-19 ENCOUNTER — Other Ambulatory Visit: Payer: Self-pay | Admitting: Family Medicine

## 2020-04-19 DIAGNOSIS — E039 Hypothyroidism, unspecified: Secondary | ICD-10-CM

## 2020-05-15 ENCOUNTER — Other Ambulatory Visit: Payer: Self-pay

## 2020-05-17 NOTE — Progress Notes (Addendum)
[facility] [address] [place], [place] [address] [phone]  Date:  05/19/2020   Name:  Diane [name]   DOB:  1951/03/18   MRN:  [id number]  PCP:  Pearline Cables, MD    Chief Complaint: No chief complaint on file.   History of Present Illness:  Diane [name] is a 70 y.o. very pleasant female patient who presents with the following:  Patient today for 74-month follow-up visit History of osteopenia, hypothyroidism, endometrial carcinoma To myself about 1 year ago with postmenopausal bleeding She had also lost about 15 pounds at that time, thought due to reduced calories She turned out to have endometrial carcinoma, in February 2021 she underwent a total hysterectomy and bilateral salpingo-oophorectomy/resection of abdominal wall mass per Dr. Andrey Farmer -most recent follow-up visit with gyn onc in August; cancer was stage III, she will follow-up with oncology every 6 months for 5 years.  She does not need to have Pap screening  She did put on a few lbs since she was sick  She notes that she is feeling well She was recently dx with increased eye pressure- not dx with glaucoma as of yet but she is on Xalatan drops   Flu vaccine- pt refused  COVID-19 vaccine- pt refuses despite long counseling session by myself  Cologuard will be due in February- order for her now  Bone density done July 2020 Mammogram up-to-date Can offer routine labs today  Wt Readings from Last 3 Encounters:  05/19/20 125 lb (56.7 kg)  12/21/19 118 lb (53.5 kg)  07/12/19 114 lb 9.6 oz (52 kg)    Patient Active Problem List   Diagnosis Date Noted  . Abdominal wall mass 06/21/2019  . Endometrial carcinoma (HCC) 06/14/2019  . Osteopenia 12/01/2018  . Hypothyroid 12/29/2012    Past Medical History:  Diagnosis Date  . Endometrial cancer (HCC)   . Hypothyroid   . Personal history of colonic polyps   . Postmenopausal bleeding   . Uterine  cancer [facility])     Past Surgical History:  Procedure Laterality Date  . COLONOSCOPY  2007   Dr. Christella Hartigan  . ROBOTIC ASSISTED TOTAL HYSTERECTOMY WITH BILATERAL SALPINGO OOPHERECTOMY Bilateral 06/21/2019   Procedure: XI ROBOTIC ASSISTED TOTAL HYSTERECTOMY WITH BILATERAL SALPINGO OOPHORECTOMY, EXCISION OF LEFT ABDOMINAL WALL MASS;  Surgeon: Adolphus Birchwood, MD;  Location: [facility];  Service: Gynecology;  Laterality: Bilateral;  . SENTINEL NODE BIOPSY N/A 06/21/2019   Procedure: SENTINEL NODE BIOPSY;  Surgeon: Adolphus Birchwood, MD;  Location: [facility];  Service: Gynecology;  Laterality: N/A;    Social History   Tobacco Use  . Smoking status: Never Smoker  . Smokeless tobacco: Never Used  Vaping Use  . Vaping Use: Never used  Substance Use Topics  . Alcohol use: No  . Drug use: No    Family History  Problem Relation Age of Onset  . Cancer Mother 4       Breast cancer  . Other Mother        polymyositis?  Marland Kitchen COPD Father   . Cancer Brother        Throat cancer  . Colon cancer Neg Hx   . Endometrial cancer Neg Hx   . Ovarian cancer Neg Hx     No Known Allergies  Medication list has been reviewed and updated.  Current Outpatient Medications on File Prior to Visit  Medication Sig Dispense Refill  . b  complex vitamins capsule Take 1 capsule by mouth every evening.     . calcium-vitamin D (OSCAL WITH D) 500-200 MG-UNIT tablet Take 1 tablet by mouth every evening.     . latanoprost (XALATAN) 0.005 % ophthalmic solution     . metroNIDAZOLE (METROCREAM) 0.75 % cream Apply 1 application topically every evening.    . Multiple Vitamin (MULTIVITAMIN WITH MINERALS) TABS tablet Take 1 tablet by mouth every evening.    . Sulfacetamide Sodium (SODIUM SULFACETAMIDE EX) Apply 1 application topically every evening.    Marland Kitchen SYNTHROID 125 MCG tablet Take 1 tablet (125 mcg total) by mouth daily before breakfast. 15 tablet 0   No current facility-administered medications on file prior to visit.    Review of  Systems:  As per HPI- otherwise negative.   Physical Examination: Vitals:   05/19/20 0827  BP: 126/60  Pulse: 90  Resp: 17  SpO2: 99%   Vitals:   05/19/20 0827  Weight: 125 lb (56.7 kg)  Height: 5\' 8"  (1.727 m)   Body mass index is 19.01 kg/m. Ideal Body Weight: Weight in (lb) to have BMI = 25: 164.1  GEN: no acute distress.  Slim build, looks well    HEENT: Atraumatic, Normocephalic. Bilateral TM wnl,.  PEERL,EOMI.   Ears and Nose: No external deformity. CV: RRR, No M/G/R. No JVD. No thrill. No extra heart sounds. PULM: CTA B, no wheezes, crackles, rhonchi. No retractions. No resp. distress. No accessory muscle use. ABD: S, NT, ND, +BS. No rebound. No HSM. EXTR: No c/c/e PSYCH: Normally interactive. Conversant.    Assessment and Plan: Hypothyroidism, unspecified type - Plan: TSH  Elevated hemoglobin A1c - Plan: Comprehensive metabolic panel, Hemoglobin A1c  Screening for diabetes mellitus - Plan: Hemoglobin A1c  Screening for hyperlipidemia - Plan: Lipid panel  Screening for deficiency anemia - Plan: CBC  Fatigue, unspecified type - Plan: TSH, VITAMIN D 25 Hydroxy (Vit-D Deficiency, Fractures)  Endometrial carcinoma (HCC)  Osteopenia, unspecified location  Patient today for follow-up visit Endometrial carcinoma being followed by GYN unk Labs are pending as above We will need to refill thyroid medication when TSH comes in  Refuses COVID-19 and flu vaccination.  I have counseled patient that she may face serious illness or death from COVID-19 This visit occurred during the SARS-CoV-2 public health emergency.  Safety protocols were in place, including screening questions prior to the visit, additional usage of staff PPE, and extensive cleaning of exam room while observing appropriate contact time as indicated for disinfecting solutions.    Signed Abbe Amsterdam, MD  Received labs as below, message to patient  Results for orders placed or performed in visit  on 05/19/20  CBC  Result Value Ref Range   WBC 3.5 (L) 4.0 - 10.5 K/uL   RBC 4.13 3.87 - 5.11 Mil/uL   Platelets 186.0 150.0 - 400.0 K/uL   Hemoglobin 13.7 12.0 - 15.0 g/dL   HCT 57.2 62.0 - 35.5 %   MCV 99.9 78.0 - 100.0 fl   MCHC 33.3 30.0 - 36.0 g/dL   RDW 97.4 16.3 - 84.5 %  Comprehensive metabolic panel  Result Value Ref Range   Sodium 140 135 - 145 mEq/L   Potassium 4.3 3.5 - 5.1 mEq/L   Chloride 105 96 - 112 mEq/L   CO2 30 19 - 32 mEq/L   Glucose, Bld 84 70 - 99 mg/dL   BUN 18 6 - 23 mg/dL   Creatinine, Ser 3.64 0.40 - 1.20 mg/dL  Total Bilirubin 0.6 0.2 - 1.2 mg/dL   Alkaline Phosphatase 43 39 - 117 U/L   AST 23 0 - 37 U/L   ALT 25 0 - 35 U/L   Total Protein 6.4 6.0 - 8.3 g/dL   Albumin 4.4 3.5 - 5.2 g/dL   GFR 58.77 (L) >60.00 mL/min   Calcium 9.5 8.4 - 10.5 mg/dL  Hemoglobin A1c  Result Value Ref Range   Hgb A1c MFr Bld 5.6 4.6 - 6.5 %  Lipid panel  Result Value Ref Range   Cholesterol 219 (H) 0 - 200 mg/dL   Triglycerides 117.0 0.0 - 149.0 mg/dL   HDL 89.40 >39.00 mg/dL   VLDL 23.4 0.0 - 40.0 mg/dL   LDL Cholesterol 106 (H) 0 - 99 mg/dL   Total CHOL/HDL Ratio 2    NonHDL 129.46   TSH  Result Value Ref Range   TSH 4.70 (H) 0.35 - 4.50 uIU/mL  VITAMIN D 25 Hydroxy (Vit-D Deficiency, Fractures)  Result Value Ref Range   VITD 34.05 30.00 - 100.00 ng/mL

## 2020-05-17 NOTE — Patient Instructions (Incomplete)
It was good to see you again today, I will be in touch your labs as soon as possible We will get a cologuard kit mailed to you; this is due to be completed in July 06, 2020; I would avoid sending it in sooner than that in case your insurance will not cover   Health Maintenance After Age 69 After age 66, you are at a higher risk for certain long-term diseases and infections as well as injuries from falls. Falls are a major cause of broken bones and head injuries in people who are older than age 81. Getting regular preventive care can help to keep you healthy and well. Preventive care includes getting regular testing and making lifestyle changes as recommended by your health care provider. Talk with your health care provider about:  Which screenings and tests you should have. A screening is a test that checks for a disease when you have no symptoms.  A diet and exercise plan that is right for you. What should I know about screenings and tests to prevent falls? Screening and testing are the best ways to find a health problem early. Early diagnosis and treatment give you the best chance of managing medical conditions that are common after age 39. Certain conditions and lifestyle choices may make you more likely to have a fall. Your health care provider may recommend:  Regular vision checks. Poor vision and conditions such as cataracts can make you more likely to have a fall. If you wear glasses, make sure to get your prescription updated if your vision changes.  Medicine review. Work with your health care provider to regularly review all of the medicines you are taking, including over-the-counter medicines. Ask your health care provider about any side effects that may make you more likely to have a fall. Tell your health care provider if any medicines that you take make you feel dizzy or sleepy.  Osteoporosis screening. Osteoporosis is a condition that causes the bones to get weaker. This can make  the bones weak and cause them to break more easily.  Blood pressure screening. Blood pressure changes and medicines to control blood pressure can make you feel dizzy.  Strength and balance checks. Your health care provider may recommend certain tests to check your strength and balance while standing, walking, or changing positions.  Foot health exam. Foot pain and numbness, as well as not wearing proper footwear, can make you more likely to have a fall.  Depression screening. You may be more likely to have a fall if you have a fear of falling, feel emotionally low, or feel unable to do activities that you used to do.  Alcohol use screening. Using too much alcohol can affect your balance and may make you more likely to have a fall. What actions can I take to lower my risk of falls? General instructions  Talk with your health care provider about your risks for falling. Tell your health care provider if: ? You fall. Be sure to tell your health care provider about all falls, even ones that seem minor. ? You feel dizzy, sleepy, or off-balance.  Take over-the-counter and prescription medicines only as told by your health care provider. These include any supplements.  Eat a healthy diet and maintain a healthy weight. A healthy diet includes low-fat dairy products, low-fat (lean) meats, and fiber from whole grains, beans, and lots of fruits and vegetables. Home safety  Remove any tripping hazards, such as rugs, cords, and clutter.  Install safety  equipment such as grab bars in bathrooms and safety rails on stairs.  Keep rooms and walkways well-lit. Activity   Follow a regular exercise program to stay fit. This will help you maintain your balance. Ask your health care provider what types of exercise are appropriate for you.  If you need a cane or walker, use it as recommended by your health care provider.  Wear supportive shoes that have nonskid soles. Lifestyle  Do not drink alcohol if  your health care provider tells you not to drink.  If you drink alcohol, limit how much you have: ? 0-1 drink a day for women. ? 0-2 drinks a day for men.  Be aware of how much alcohol is in your drink. In the [place] one drink equals one typical bottle of beer (12 oz), one-half glass of wine (5 oz), or one shot of hard liquor (1 oz).  Do not use any products that contain nicotine or tobacco, such as cigarettes and e-cigarettes. If you need help quitting, ask your health care provider. Summary  Having a healthy lifestyle and getting preventive care can help to protect your health and wellness after age 61.  Screening and testing are the best way to find a health problem early and help you avoid having a fall. Early diagnosis and treatment give you the best chance for managing medical conditions that are more common for people who are older than age 48.  Falls are a major cause of broken bones and head injuries in people who are older than age 19. Take precautions to prevent a fall at home.  Work with your health care provider to learn what changes you can make to improve your health and wellness and to prevent falls. This information is not intended to replace advice given to you by your health care provider. Make sure you discuss any questions you have with your health care provider. Document Revised: 08/24/2018 Document Reviewed: 03/16/2017 Elsevier Patient Education  2020 [employer].

## 2020-05-19 ENCOUNTER — Encounter: Payer: Self-pay | Admitting: Family Medicine

## 2020-05-19 ENCOUNTER — Other Ambulatory Visit: Payer: Self-pay

## 2020-05-19 ENCOUNTER — Ambulatory Visit (INDEPENDENT_AMBULATORY_CARE_PROVIDER_SITE_OTHER): Payer: Medicare HMO | Admitting: Family Medicine

## 2020-05-19 VITALS — BP 126/60 | HR 90 | Resp 17 | Ht 68.0 in | Wt 125.0 lb

## 2020-05-19 DIAGNOSIS — R7309 Other abnormal glucose: Secondary | ICD-10-CM

## 2020-05-19 DIAGNOSIS — E039 Hypothyroidism, unspecified: Secondary | ICD-10-CM | POA: Diagnosis not present

## 2020-05-19 DIAGNOSIS — Z1211 Encounter for screening for malignant neoplasm of colon: Secondary | ICD-10-CM | POA: Diagnosis not present

## 2020-05-19 DIAGNOSIS — C541 Malignant neoplasm of endometrium: Secondary | ICD-10-CM | POA: Diagnosis not present

## 2020-05-19 DIAGNOSIS — M8580 Other specified disorders of bone density and structure, unspecified site: Secondary | ICD-10-CM

## 2020-05-19 DIAGNOSIS — R5383 Other fatigue: Secondary | ICD-10-CM | POA: Diagnosis not present

## 2020-05-19 DIAGNOSIS — Z130 Encounter for screening for diseases of the blood and blood-forming organs and certain disorders involving the immune mechanism: Secondary | ICD-10-CM | POA: Diagnosis not present

## 2020-05-19 DIAGNOSIS — Z131 Encounter for screening for diabetes mellitus: Secondary | ICD-10-CM | POA: Diagnosis not present

## 2020-05-19 DIAGNOSIS — Z13220 Encounter for screening for lipoid disorders: Secondary | ICD-10-CM

## 2020-05-19 DIAGNOSIS — D72819 Decreased white blood cell count, unspecified: Secondary | ICD-10-CM

## 2020-05-19 LAB — LIPID PANEL
Cholesterol: 219 mg/dL — ABNORMAL HIGH (ref 0–200)
HDL: 89.4 mg/dL (ref >39.00)
LDL Cholesterol: 106 mg/dL — ABNORMAL HIGH (ref 0–99)
NonHDL: 129.46
Total CHOL/HDL Ratio: 2
Triglycerides: 117 mg/dL (ref 0.0–149.0)
VLDL: 23.4 mg/dL (ref 0.0–40.0)

## 2020-05-19 LAB — CBC
HCT: 41.3 % (ref 36.0–46.0)
Hemoglobin: 13.7 g/dL (ref 12.0–15.0)
MCHC: 33.3 g/dL (ref 30.0–36.0)
MCV: 99.9 fl (ref 78.0–100.0)
Platelets: 186 10*3/uL (ref 150.0–400.0)
RBC: 4.13 Mil/uL (ref 3.87–5.11)
RDW: 13.9 % (ref 11.5–15.5)
WBC: 3.5 10*3/uL — ABNORMAL LOW (ref 4.0–10.5)

## 2020-05-19 LAB — COMPREHENSIVE METABOLIC PANEL
ALT: 25 U/L (ref 0–35)
AST: 23 U/L (ref 0–37)
Albumin: 4.4 g/dL (ref 3.5–5.2)
Alkaline Phosphatase: 43 U/L (ref 39–117)
BUN: 18 mg/dL (ref 6–23)
CO2: 30 mEq/L (ref 19–32)
Calcium: 9.5 mg/dL (ref 8.4–10.5)
Chloride: 105 mEq/L (ref 96–112)
Creatinine, Ser: 0.99 mg/dL (ref 0.40–1.20)
GFR: 58.77 mL/min — ABNORMAL LOW (ref >60.00)
Glucose, Bld: 84 mg/dL (ref 70–99)
Potassium: 4.3 mEq/L (ref 3.5–5.1)
Sodium: 140 mEq/L (ref 135–145)
Total Bilirubin: 0.6 mg/dL (ref 0.2–1.2)
Total Protein: 6.4 g/dL (ref 6.0–8.3)

## 2020-05-19 LAB — VITAMIN D 25 HYDROXY (VIT D DEFICIENCY, FRACTURES): VITD: 34.05 ng/mL (ref 30.00–100.00)

## 2020-05-19 LAB — HEMOGLOBIN A1C: Hgb A1c MFr Bld: 5.6 % (ref 4.6–6.5)

## 2020-05-19 LAB — TSH: TSH: 4.7 u[IU]/mL — ABNORMAL HIGH (ref 0.35–4.50)

## 2020-05-19 MED ORDER — LEVOTHYROXINE SODIUM 150 MCG PO TABS: 150.0000 ug | ORAL_TABLET | Freq: Every day | ORAL | 3 refills | Status: DC

## 2020-05-19 NOTE — Addendum Note (Signed)
Addended by: Abbe Amsterdam C on: 05/19/2020 04:48 PM   Modules accepted: Orders

## 2020-05-19 NOTE — Addendum Note (Signed)
Addended by: Steve Rattler A on: 05/19/2020 11:13 AM   Modules accepted: Orders

## 2020-06-16 ENCOUNTER — Other Ambulatory Visit (INDEPENDENT_AMBULATORY_CARE_PROVIDER_SITE_OTHER): Payer: Medicare HMO

## 2020-06-16 ENCOUNTER — Other Ambulatory Visit: Payer: Self-pay

## 2020-06-16 ENCOUNTER — Encounter: Payer: Self-pay | Admitting: Family Medicine

## 2020-06-16 DIAGNOSIS — E039 Hypothyroidism, unspecified: Secondary | ICD-10-CM | POA: Diagnosis not present

## 2020-06-16 DIAGNOSIS — D72819 Decreased white blood cell count, unspecified: Secondary | ICD-10-CM

## 2020-06-16 LAB — TSH: TSH: 2.65 u[IU]/mL (ref 0.35–4.50)

## 2020-06-16 LAB — CBC
HCT: 40.4 % (ref 36.0–46.0)
Hemoglobin: 13.6 g/dL (ref 12.0–15.0)
MCHC: 33.6 g/dL (ref 30.0–36.0)
MCV: 99.1 fl (ref 78.0–100.0)
Platelets: 217 10*3/uL (ref 150.0–400.0)
RBC: 4.07 Mil/uL (ref 3.87–5.11)
RDW: 13.7 % (ref 11.5–15.5)
WBC: 3.8 10*3/uL — ABNORMAL LOW (ref 4.0–10.5)

## 2020-07-04 ENCOUNTER — Encounter: Payer: Self-pay | Admitting: Gynecologic Oncology

## 2020-07-07 ENCOUNTER — Other Ambulatory Visit: Payer: Self-pay

## 2020-07-07 ENCOUNTER — Encounter: Payer: Self-pay | Admitting: Gynecologic Oncology

## 2020-07-07 ENCOUNTER — Inpatient Hospital Stay: Payer: Medicare HMO | Attending: Gynecologic Oncology | Admitting: Gynecologic Oncology

## 2020-07-07 VITALS — BP 107/72 | HR 80 | Temp 98.0°F | Resp 16 | Ht 68.0 in | Wt 123.0 lb

## 2020-07-07 DIAGNOSIS — E039 Hypothyroidism, unspecified: Secondary | ICD-10-CM | POA: Diagnosis not present

## 2020-07-07 DIAGNOSIS — Z90710 Acquired absence of both cervix and uterus: Secondary | ICD-10-CM | POA: Insufficient documentation

## 2020-07-07 DIAGNOSIS — C541 Malignant neoplasm of endometrium: Secondary | ICD-10-CM | POA: Insufficient documentation

## 2020-07-07 DIAGNOSIS — Z90722 Acquired absence of ovaries, bilateral: Secondary | ICD-10-CM

## 2020-07-07 NOTE — Patient Instructions (Signed)
Dr Denman George is recommending that you return in 6 months.  Please contact Dr Serita Grit office (at [phone]) in or after April to request an appointment with her for August, 2022.

## 2020-07-07 NOTE — Progress Notes (Signed)
Gynecologic Oncology Follow-up Visit  Chief Complaint:  Chief Complaint  Patient presents with  . Endometrial carcinoma [facility])    Assessment/Plan:  [name]  is a 70 y.o.  year old with stage IA grade 3 endometrioid cancer (MSI stable/MMR proficient).s/p staging on 06/21/19.  Pathology revealed low risk factors for recurrence, therefore no adjuvant therapy is recommended according to NCCN guidelines.  I discussed risk for recurrence and typical symptoms encouraged her to notify us of these should they develop between visits.  I recommend she have follow-up every 6 months for 5 years in accordance with NCCN guidelines. Those visits should include symptom assessment, physical exam and pelvic examination. Pap smears are not indicated or recommended in the routine surveillance of endometrial cancer.  HPI: [name] is a 70 year old P3 who was seen in consultation at the request of Dr. Ihor Dow for evaluation of grade 3 endometrial cancer.  The patient reported that she began experiencing postmenopausal bleeding in late November 2020.  She was seen by her primary care physician who acknowledged that this was uterine bleeding and referred her to her gynecologist.  She underwent a transvaginal ultrasound scan on June 06, 2019 which revealed a uterus measuring 6.1 x 3.7 x 5.1 cm with a 22 mm endometrial thickness.  Right and left ovaries were normal.  She then underwent a transvaginal endometrial [place] biopsy on June 14, 2019.  This revealed high-grade endometrial adenocarcinoma with a differential diagnosis including endometrioid or serous carcinoma.  Pap test was normal with negative high-risk HPV performed on October 30, 2018.  The patient underwent a CT scan of the abdomen and pelvis with IV contrast on June 14, 2019 which revealed a heterogeneous 2.4 cm mass in the fundus of the uterus but no evidence of metastatic disease.  There was some aortic atherosclerosis but  no additional findings that were significant.  Interval Hx:  On 06/26/19 she underwent a robotic assisted total hysterectomy, BSO, SLN biopsy. Intraoperative findings were significant for a small normal sized uterus and a left abdominal sebaceous cyst. Surgery was uncomplicated.  Final pathology revealed a FIGO grade 3 stage IA endometrioid endometrial cancer. There was inner half myo invasion (2 of 29mm). There was no LVSI, no cervical/adnexal/nodal inolvement. The tumor was MSI stable/MMR proficient. She met low risk factors for recurrence and therefore no adjuvant therapy was recommended in accordance with NCCN guidelines.  Since surgery she has done well with no complaints. She receives all of her follow-up with me for her gyn cancer surveillance.    Current Meds:  Outpatient Encounter Medications as of 07/07/2020  Medication Sig  . b complex vitamins capsule Take 1 capsule by mouth every evening.   . calcium-vitamin D (OSCAL WITH D) 500-200 MG-UNIT tablet Take 1 tablet by mouth every evening.   . latanoprost (XALATAN) 0.005 % ophthalmic solution   . levothyroxine (SYNTHROID) 150 MCG tablet Take 1 tablet (150 mcg total) by mouth daily.  . metroNIDAZOLE (METROCREAM) 0.75 % cream Apply 1 application topically every evening.  . Multiple Vitamin (MULTIVITAMIN WITH MINERALS) TABS tablet Take 1 tablet by mouth every evening.  . SULFACLEANSE 8/4 8-4 % SUSP 1 application apply on the skin DAILY  Wash face once a day  . [DISCONTINUED] Sulfacetamide Sodium (SODIUM SULFACETAMIDE EX) Apply 1 application topically every evening.   No facility-administered encounter medications on file as of 07/07/2020.    Allergy: No Known Allergies  Social Hx:   Social History   Socioeconomic History  .  Marital status: Married    Spouse name: Not on file  . Number of children: Not on file  . Years of education: Not on file  . Highest education level: Not on file  Occupational History  . Not on file  Tobacco  Use  . Smoking status: Never Smoker  . Smokeless tobacco: Never Used  Vaping Use  . Vaping Use: Never used  Substance and Sexual Activity  . Alcohol use: No  . Drug use: No  . Sexual activity: Yes    Birth control/protection: None  Other Topics Concern  . Not on file  Social History Narrative  . Not on file   Social Determinants of Health   Financial Resource Strain: Not on file  Food Insecurity: Not on file  Transportation Needs: Not on file  Physical Activity: Not on file  Stress: Not on file  Social Connections: Not on file  Intimate Partner Violence: Not on file    Past Surgical Hx:  Past Surgical History:  Procedure Laterality Date  . COLONOSCOPY  2007   Dr. Ardis Hughs  . ROBOTIC ASSISTED TOTAL HYSTERECTOMY WITH BILATERAL SALPINGO OOPHERECTOMY Bilateral 06/21/2019   Procedure: XI ROBOTIC ASSISTED TOTAL HYSTERECTOMY WITH BILATERAL SALPINGO OOPHORECTOMY, EXCISION OF LEFT ABDOMINAL WALL MASS;  Surgeon: Everitt Amber, MD;  Location: [facility];  Service: Gynecology;  Laterality: Bilateral;  . SENTINEL NODE BIOPSY N/A 06/21/2019   Procedure: SENTINEL NODE BIOPSY;  Surgeon: Everitt Amber, MD;  Location: [facility];  Service: Gynecology;  Laterality: N/A;    Past Medical Hx:  Past Medical History:  Diagnosis Date  . Endometrial cancer ([place])   . Hypothyroid   . Personal history of colonic polyps   . Postmenopausal bleeding   . Uterine cancer [facility])     Past Gynecological History:  See HPI No LMP recorded. Patient is postmenopausal.  Family Hx:  Family History  Problem Relation Age of Onset  . Cancer Mother 24       Breast cancer  . Other Mother        polymyositis?  Marland Kitchen COPD Father   . Cancer Brother        Throat cancer  . Colon cancer Neg Hx   . Endometrial cancer Neg Hx   . Ovarian cancer Neg Hx     Review of Systems:  Constitutional  Feels well,    ENT Normal appearing ears and nares bilaterally Skin/Breast  No rash, sores, jaundice, itching, dryness Cardiovascular   No chest pain, shortness of breath, or edema  Pulmonary  No cough or wheeze.  Gastro Intestinal  No nausea, vomitting, or diarrhoea. No bright red blood per rectum, no abdominal pain, change in bowel movement, or constipation.  Genito Urinary  No frequency, urgency, dysuria, no bleeding Musculo Skeletal  No myalgia, arthralgia, joint swelling or pain  Neurologic  No weakness, numbness, change in gait,  Psychology  No depression, anxiety, insomnia.   Vitals:  Blood pressure 107/72, pulse 80, temperature 98 F (36.7 C), temperature source Tympanic, resp. rate 16, height $RemoveBe'5\' 8"'fiEweMfln$  (1.727 m), weight 123 lb (55.8 kg), SpO2 100 %.  Physical Exam: WD in NAD Neck  Supple NROM, without any enlargements.  Lymph Node Survey No cervical supraclavicular or inguinal adenopathy Cardiovascular  Pulse normal rate, regularity and rhythm. S1 and S2 normal.  Lungs  Clear to auscultation bilateraly, without wheezes/crackles/rhonchi. Good air movement.  Skin  No rash/lesions/breakdown  Psychiatry  Alert and oriented to person, place, and time  Abdomen  Normoactive bowel sounds,  abdomen soft, non-tender and thin without evidence of hernia. Incisions healing normally. Back No CVA tenderness Genito Urinary  Vulva/vagina: Normal external female genitalia.  No lesions. No discharge or bleeding.  Bladder/urethra:  No lesions or masses, well supported bladder  Vagina: smooth, no lesions, cuff healing normally.  Cervix and uterus surgically absent  Adnexa: no palpable masses. Rectal  deferred Extremities  No bilateral cyanosis, clubbing or edema.  Thereasa Solo, MD  07/07/2020, 1:45 PM

## 2020-07-14 DIAGNOSIS — Z1211 Encounter for screening for malignant neoplasm of colon: Secondary | ICD-10-CM | POA: Diagnosis not present

## 2020-07-21 LAB — COLOGUARD
COLOGUARD: NEGATIVE
Cologuard: NEGATIVE

## 2020-07-23 ENCOUNTER — Encounter: Payer: Self-pay | Admitting: Family Medicine

## 2020-09-10 ENCOUNTER — Other Ambulatory Visit: Payer: Self-pay | Admitting: Family Medicine

## 2020-12-01 ENCOUNTER — Telehealth: Payer: Self-pay

## 2020-12-01 NOTE — Telephone Encounter (Signed)
Received message from [facility] requesting an appointment with Dr. Denman George for August. Appointment has been scheduled for 8/31 at 3:15 pm. Patient is in agreement of date and time.

## 2020-12-19 NOTE — Progress Notes (Signed)
[place] at [facility] [place], [place], [place] [id number] [id number] [phone]  Date:  12/24/2020   Name:  Diane [name]   DOB:  [birth year]   MRN:  [id number]  PCP:  Darreld Mclean, MD    Chief Complaint: Hypothyroidism (Follow up on medication/)   History of Present Illness:  Diane [name] is a 70 y.o. very pleasant female patient who presents with the following:  Patient seen today for periodic follow-up visit Most recent visit with myself was in January History of osteopenia, hypothyroidism, endometrial carcinoma She underwent a total hysterectomy and resection of abdominal wall mass in February of last year due to endometrial carcinoma Most recent follow-up with Dr. Denman George was in [place] are doing every 87-monthfollow-up for 5 years, no Pap screening recommended  She has refused flu and COVID-19 vaccination  Mammogram- order today  Cologuard up-to-date Blood work done in January Can update DEXA scan- order today  Levo 150  Her energy level is good - overall she feels great However she is still down from her pre-cancer weight and has lost 5 lbs since the winter  Weight 2017 130 lbs  Wt Readings from Last 3 Encounters:  12/24/20 118 lb (53.5 kg)  07/07/20 123 lb (55.8 kg)  05/19/20 125 lb (56.7 kg)     Patient Active Problem List   Diagnosis Date Noted   Abdominal wall mass 06/21/2019   [place] 06/14/2019   Osteopenia 12/01/2018   Hypothyroid 12/29/2012    Past Medical History:  Diagnosis Date   [place]ometrial cancer ([place]    Hypothyroid    Personal history of colonic polyps    Postmenopausal bleeding    [place] ([place]     Past Surgical History:  Procedure Laterality Date   COLONOSCOPY  2007   Dr. JArdis Hughs  ROBOTIC ASSISTED TOTAL HYSTERECTOMY WITH BILATERAL SALPINGO OOPHERECTOMY Bilateral 06/21/2019   Procedure: XI ROBOTIC ASSISTED TOTAL HYSTERECTOMY WITH BILATERAL SALPINGO  OOPHORECTOMY, EXCISION OF LEFT ABDOMINAL WALL MASS;  Surgeon: REveritt Amber MD;  Location: [facility];  Service: Gynecology;  Laterality: Bilateral;   SENTINEL NODE BIOPSY N/A 06/21/2019   Procedure: SENTINEL NODE BIOPSY;  Surgeon: REveritt Amber MD;  Location: [facility];  Service: Gynecology;  Laterality: N/A;    Social History   Tobacco Use   Smoking status: Never   Smokeless tobacco: Never  Vaping Use   Vaping Use: Never used  Substance Use Topics   Alcohol use: No   Drug use: No    Family History  Problem Relation Age of Onset   Cancer Mother 749      Breast cancer   Other Mother        polymyositis?   COPD Father    Cancer Brother        Throat cancer   Colon cancer Neg Hx    Endometrial cancer Neg Hx    Ovarian cancer Neg Hx     No Known Allergies  Medication list has been reviewed and updated.  Current Outpatient Medications on File Prior to Visit  Medication Sig Dispense Refill   b complex vitamins capsule Take 1 capsule by mouth every evening.      calcium-vitamin D (OSCAL WITH D) 500-200 MG-UNIT tablet Take 1 tablet by mouth every evening.      latanoprost (XALATAN) 0.005 % ophthalmic solution      levothyroxine (SYNTHROID) 150 MCG tablet TAKE 1 TABLET BY  MOUTH EVERY DAY 90 tablet 1   metroNIDAZOLE (METROCREAM) 0.75 % cream Apply 1 application topically every evening.     Multiple Vitamin (MULTIVITAMIN WITH MINERALS) TABS tablet Take 1 tablet by mouth every evening.     SULFACLEANSE 8/4 8-4 % SUSP 1 application apply on the skin DAILY  Wash face once a day     No current facility-administered medications on file prior to visit.    Review of Systems:  As per HPI- otherwise negative.   Physical Examination: Vitals:   12/24/20 1053  BP: 116/72  Pulse: 78  Resp: 16  Temp: (!) 97.1 F (36.2 C)  SpO2: 98%   Vitals:   12/24/20 1053  Weight: 118 lb (53.5 kg)  Height: '5\' 8"'$  (1.727 m)   Body mass index is 17.94 kg/m. Ideal Body Weight: Weight in (lb) to have  BMI = 25: 164.1  GEN: no acute distress. Underweight, ow looks well  HEENT: Atraumatic, Normocephalic.  Ears and Nose: No external deformity. CV: RRR, No M/G/R. No JVD. No thrill. No extra heart sounds. PULM: CTA B, no wheezes, crackles, rhonchi. No retractions. No resp. distress. No accessory muscle use. ABD: S, NT, ND, +BS. EXTR: No c/c/e PSYCH: Normally interactive. Conversant.    Assessment and Plan: Hypothyroidism, unspecified type - Plan: TSH  Endometrial carcinoma ([place])  Screening mammogram for breast cancer - Plan: MM 3D SCREEN BREAST BILATERAL  Estrogen deficiency - Plan: DG Bone Density  Underweight Following up today Ordered dexa and mamo Check TSH- weight is too low. Encouraged her to include higher calorie foods Her GYN onc follow-up is later this month Screening OW UTD  This visit occurred during the SARS-CoV-2 public health emergency.  Safety protocols were in place, including screening questions prior to the visit, additional usage of staff PPE, and extensive cleaning of exam room while observing appropriate contact time as indicated for disinfecting solutions.   Signed Lamar Blinks, MD

## 2020-12-24 ENCOUNTER — Encounter: Payer: Self-pay | Admitting: Family Medicine

## 2020-12-24 ENCOUNTER — Other Ambulatory Visit: Payer: Self-pay

## 2020-12-24 ENCOUNTER — Ambulatory Visit (INDEPENDENT_AMBULATORY_CARE_PROVIDER_SITE_OTHER): Payer: Medicare HMO | Admitting: Family Medicine

## 2020-12-24 VITALS — BP 116/72 | HR 78 | Temp 97.1°F | Resp 16 | Ht 68.0 in | Wt 118.0 lb

## 2020-12-24 DIAGNOSIS — Z1231 Encounter for screening mammogram for malignant neoplasm of breast: Secondary | ICD-10-CM | POA: Diagnosis not present

## 2020-12-24 DIAGNOSIS — E039 Hypothyroidism, unspecified: Secondary | ICD-10-CM | POA: Diagnosis not present

## 2020-12-24 DIAGNOSIS — E2839 Other primary ovarian failure: Secondary | ICD-10-CM

## 2020-12-24 DIAGNOSIS — C541 Malignant neoplasm of endometrium: Secondary | ICD-10-CM

## 2020-12-24 DIAGNOSIS — R636 Underweight: Secondary | ICD-10-CM | POA: Diagnosis not present

## 2020-12-24 LAB — TSH: TSH: 0.4 u[IU]/mL (ref 0.35–5.50)

## 2020-12-24 NOTE — Patient Instructions (Signed)
Good to see you again today- I will be in touch with your TSH level Assuming all is well please see me in 6 months for a physical I ordered a bone density and mammogram to be done at the [facility] - Phone: [phone]  Work on increasing your fat and calorie intake and try to gain a few lbs

## 2020-12-25 MED ORDER — LEVOTHYROXINE SODIUM 125 MCG PO TABS
125.0000 ug | ORAL_TABLET | Freq: Every day | ORAL | 6 refills | Status: DC
Start: 2020-12-25 — End: 2021-07-20

## 2021-01-01 ENCOUNTER — Ambulatory Visit
Admission: RE | Admit: 2021-01-01 | Discharge: 2021-01-01 | Disposition: A | Discharge status: Home / Self Care | Payer: Medicare HMO | Source: Ambulatory Visit | Attending: Family Medicine | Admitting: Family Medicine

## 2021-01-01 ENCOUNTER — Other Ambulatory Visit: Payer: Self-pay

## 2021-01-01 DIAGNOSIS — Z780 Asymptomatic menopausal state: Secondary | ICD-10-CM | POA: Diagnosis not present

## 2021-01-01 DIAGNOSIS — M8589 Other specified disorders of bone density and structure, multiple sites: Secondary | ICD-10-CM | POA: Diagnosis not present

## 2021-01-01 DIAGNOSIS — E2839 Other primary ovarian failure: Secondary | ICD-10-CM

## 2021-01-01 DIAGNOSIS — Z1231 Encounter for screening mammogram for malignant neoplasm of breast: Secondary | ICD-10-CM

## 2021-01-02 ENCOUNTER — Encounter: Payer: Self-pay | Admitting: Family Medicine

## 2021-01-14 ENCOUNTER — Encounter: Payer: Self-pay | Admitting: Gynecologic Oncology

## 2021-01-14 ENCOUNTER — Inpatient Hospital Stay: Payer: Medicare HMO | Attending: Gynecologic Oncology | Admitting: Gynecologic Oncology

## 2021-01-14 ENCOUNTER — Other Ambulatory Visit: Payer: Self-pay

## 2021-01-14 VITALS — BP 111/74 | HR 72 | Temp 98.4°F | Resp 16 | Ht 68.0 in | Wt 120.0 lb

## 2021-01-14 DIAGNOSIS — Z90710 Acquired absence of both cervix and uterus: Secondary | ICD-10-CM | POA: Insufficient documentation

## 2021-01-14 DIAGNOSIS — Z8542 Personal history of malignant neoplasm of other parts of uterus: Secondary | ICD-10-CM | POA: Diagnosis not present

## 2021-01-14 DIAGNOSIS — Z90722 Acquired absence of ovaries, bilateral: Secondary | ICD-10-CM | POA: Insufficient documentation

## 2021-01-14 DIAGNOSIS — C541 Malignant neoplasm of endometrium: Secondary | ICD-10-CM

## 2021-01-14 NOTE — Progress Notes (Signed)
Gynecologic Oncology Follow-up Visit  Chief Complaint:  Chief Complaint  Patient presents with   Endometrial carcinoma [facility])    Assessment/Plan:  [name]  is a 70 y.o.  year old with stage IA grade 3 endometrioid cancer (MSI stable/MMR proficient).s/p staging on 06/21/19.  Pathology revealed low risk factors for recurrence, therefore no adjuvant therapy is recommended according to NCCN guidelines.  I discussed risk for recurrence and typical symptoms encouraged her to notify us of these should they develop between visits.  I recommend she have follow-up every 6 months for 5 years in accordance with NCCN guidelines. Those visits should include symptom assessment, physical exam and pelvic examination. Pap smears are not indicated or recommended in the routine surveillance of endometrial cancer.  She will follow-up with my partner Dr Delsa Sale or Joylene John NP in 6 months as I am leaving the practice.   HPI: [name] is a 70 year old P3 who was seen in consultation at the request of Dr. Ihor [name] for evaluation of grade 3 endometrial cancer.  The patient reported that she began experiencing postmenopausal bleeding in late November 2020.  She was seen by her primary care physician who acknowledged that this was uterine bleeding and referred her to her gynecologist.  She underwent a transvaginal ultrasound scan on June 06, 2019 which revealed a uterus measuring 6.1 x 3.7 x 5.1 cm with a 22 mm endometrial thickness.  Right and left ovaries were normal.  She then underwent a transvaginal endometrial [place] biopsy on June 14, 2019.  This revealed high-grade endometrial adenocarcinoma with a differential diagnosis including endometrioid or serous carcinoma.  Pap test was normal with negative high-risk HPV performed on October 30, 2018.  The patient underwent a CT scan of the abdomen and pelvis with IV contrast on June 14, 2019 which revealed a heterogeneous 2.4  cm mass in the fundus of the uterus but no evidence of metastatic disease.  There was some aortic atherosclerosis but no additional findings that were significant.  Interval Hx:  On 06/26/19 she underwent a robotic assisted total hysterectomy, BSO, SLN biopsy. Intraoperative findings were significant for a small normal sized uterus and a left abdominal sebaceous cyst. Surgery was uncomplicated.  Final pathology revealed a FIGO grade 3 stage IA endometrioid endometrial cancer. There was inner half myo invasion (2 of 6m). There was no LVSI, no cervical/adnexal/nodal inolvement. The tumor was MSI stable/MMR proficient. She met low risk factors for recurrence and therefore no adjuvant therapy was recommended in accordance with NCCN guidelines.  Since surgery she has done well with no complaints. [place] [facility] [place] [place]    Current Meds:  Outpatient Encounter Medications as of 01/14/2021  Medication Sig   b complex vitamins capsule Take 1 capsule by mouth every evening.    calcium-vitamin D (OSCAL WITH D) 500-200 MG-UNIT tablet Take 1 tablet by mouth every evening.    latanoprost (XALATAN) 0.005 % ophthalmic solution    levothyroxine (SYNTHROID) 125 MCG tablet Take 1 tablet (125 mcg total) by mouth daily.   metroNIDAZOLE (METROCREAM) 0.75 % cream Apply 1 application topically every evening.   Multiple Vitamin (MULTIVITAMIN WITH MINERALS) TABS tablet Take 1 tablet by mouth every evening.   SULFACLEANSE 8/4 8-4 % SUSP 1 application apply on the skin DAILY  Wash face once a day   No facility-administered encounter medications on file as of 01/14/2021.    Allergy: No Known Allergies  Social Hx:   Social History   Socioeconomic History   Marital status: Married    Spouse name: Not on file   Number of children: Not on file   Years of education: Not on file   Highest education level: Not on file  Occupational History   Not  on file  Tobacco Use   Smoking status: Never   Smokeless tobacco: Never  Vaping Use   Vaping Use: Never used  Substance and Sexual Activity   Alcohol use: No   Drug use: No   Sexual activity: Yes    Birth control/protection: None  Other Topics Concern   Not on file  Social History Narrative   Not on file   Social Determinants of Health   Financial Resource Strain: Not on file  Food Insecurity: Not on file  Transportation Needs: Not on file  Physical Activity: Not on file  Stress: Not on file  Social Connections: Not on file  Intimate Partner Violence: Not on file    Past Surgical Hx:  Past Surgical History:  Procedure Laterality Date   COLONOSCOPY  2007   Dr. Ardis Hughs   ROBOTIC ASSISTED TOTAL HYSTERECTOMY WITH BILATERAL SALPINGO OOPHERECTOMY Bilateral 06/21/2019   Procedure: XI ROBOTIC ASSISTED TOTAL HYSTERECTOMY WITH BILATERAL SALPINGO OOPHORECTOMY, EXCISION OF LEFT ABDOMINAL WALL MASS;  Surgeon: Everitt Amber, MD;  Location: [facility];  Service: Gynecology;  Laterality: Bilateral;   SENTINEL NODE BIOPSY N/A 06/21/2019   Procedure: SENTINEL NODE BIOPSY;  Surgeon: Everitt Amber, MD;  [facility]: [facility];  Service: Gynecology;  Laterality: N/A;    Past Medical Hx:  Past Medical History:  Diagnosis Date   Endometrial cancer ([place])    Hypothyroid    Personal history of colonic polyps    Postmenopausal bleeding    Uterine cancer ([place])     Past Gynecological History:  See HPI No LMP recorded. Patient is postmenopausal.  Family Hx:  Family History  Problem Relation Age of Onset   Cancer Mother 75       Breast cancer   Other Mother        polymyositis?   COPD Father    Cancer Brother        Throat cancer   Colon cancer Neg Hx    Endometrial cancer Neg Hx    Ovarian cancer Neg Hx     Review of Systems:  Constitutional  Feels well,    ENT Normal appearing ears and nares bilaterally Skin/Breast  No rash, sores, jaundice, itching, dryness Cardiovascular  No chest pain,  shortness of breath, or edema  Pulmonary  No cough or wheeze.  Gastro Intestinal  No nausea, vomitting, or diarrhoea. No bright red blood per rectum, no abdominal pain, change in bowel movement, or constipation.  Genito Urinary  No frequency, urgency, dysuria, no bleeding Musculo Skeletal  No myalgia, arthralgia, joint swelling or pain  Neurologic  No weakness, numbness, change in gait,  Psychology  No depression, anxiety, insomnia.   Vitals:  Blood pressure 111/74, pulse 72, temperature 98.4 F (36.9 C), temperature source Oral, resp. rate 16, height _0  (1.727 m), weight 120 lb (54.4 kg), SpO2 98 %.  Physical Exam: WD in NAD Neck  Supple NROM, without any enlargements.  Lymph Node Survey No cervical supraclavicular or inguinal adenopathy Cardiovascular  Pulse normal rate, regularity and rhythm. S1 and S2 normal.  Lungs  Clear to auscultation bilateraly, without wheezes/crackles/rhonchi. Good air movement.  Skin  No rash/lesions/breakdown  Psychiatry  Alert and oriented to  person, place, and time  Abdomen  Normoactive bowel sounds, abdomen soft, non-tender and thin without evidence of hernia. Incisions healing normally. Back No CVA tenderness Genito Urinary  Vulva/vagina: Normal external female genitalia.  No lesions. No discharge or bleeding.  Bladder/urethra:  No lesions or masses, well supported bladder  Vagina: smooth, no lesions, cuff healing normally.  Cervix and uterus surgically absent  Adnexa: no palpable masses. Rectal  deferred Extremities  No bilateral cyanosis, clubbing or edema.  Thereasa Solo, MD  01/14/2021, 3:31 PM

## 2021-01-14 NOTE — Patient Instructions (Signed)
Please notify Dr Denman George at phone number [phone] if you notice vaginal bleeding, new pelvic or abdominal pains, bloating, feeling full easy, or a change in bladder or bowel function.    Dr Denman George is departing the [place] at [facility] in October, 2022. Her partners and colleagues including Dr Berline Lopes, Dr Delsa Sale and Joylene John, Nurse Practitioner will be available to continue your care.   You are next scheduled to return to the Gynecologic Oncology office at the [facility] in February. Please call [phone] in December to request an appointment for February with Dr Serita Grit partner, Joylene John, NP.

## 2021-02-03 DIAGNOSIS — J324 Chronic pansinusitis: Secondary | ICD-10-CM | POA: Diagnosis not present

## 2021-02-04 ENCOUNTER — Other Ambulatory Visit: Payer: Medicare HMO

## 2021-02-11 ENCOUNTER — Other Ambulatory Visit: Payer: Self-pay

## 2021-02-11 ENCOUNTER — Encounter: Payer: Self-pay | Admitting: Family Medicine

## 2021-02-11 ENCOUNTER — Other Ambulatory Visit (INDEPENDENT_AMBULATORY_CARE_PROVIDER_SITE_OTHER): Payer: Medicare HMO

## 2021-02-11 DIAGNOSIS — E039 Hypothyroidism, unspecified: Secondary | ICD-10-CM

## 2021-02-11 LAB — TSH: TSH: 2.18 u[IU]/mL (ref 0.35–5.50)

## 2021-03-02 ENCOUNTER — Telehealth: Payer: Self-pay | Admitting: Family Medicine

## 2021-03-02 NOTE — Telephone Encounter (Signed)
Left message for patient to call back and schedule Medicare Annual Wellness Visit (AWV) in office.  ° °If not able to come in office, please offer to do virtually or by telephone.  Left office number and my jabber #[phone]. ° °Due for AWVI ° °Please schedule at anytime with Nurse Health Advisor. °  °

## 2021-03-10 ENCOUNTER — Other Ambulatory Visit: Payer: Self-pay | Admitting: Family Medicine

## 2021-03-16 ENCOUNTER — Ambulatory Visit (INDEPENDENT_AMBULATORY_CARE_PROVIDER_SITE_OTHER): Payer: Medicare HMO

## 2021-03-16 VITALS — Ht 68.0 in | Wt 120.0 lb

## 2021-03-16 DIAGNOSIS — Z0000 Encounter for general adult medical examination without abnormal findings: Secondary | ICD-10-CM

## 2021-03-16 NOTE — Progress Notes (Signed)
Subjective:   Diane [name] is a 70 y.o. female who presents for an Initial Medicare Annual Wellness Visit.  I connected with Addy today by telephone and verified that I am speaking with the correct person using two identifiers. Location patient: home Location provider: work Persons participating in the virtual visit: patient, Marine scientist.    I discussed the limitations, risks, security and privacy concerns of performing an evaluation and management service by telephone and the availability of in person appointments. I also discussed with the patient that there may be a patient responsible charge related to this service. The patient expressed understanding and verbally consented to this telephonic visit.    Interactive audio and video telecommunications were attempted between this provider and patient, however failed, due to patient having technical difficulties OR patient did not have access to video capability.  We continued and completed visit with audio only.  Some vital signs may be absent or patient reported.   Time Spent with patient on telephone encounter: 20 minutes   Review of Systems     Cardiac Risk Factors include: advanced age (>81men, >18 women)     Objective:    Today's Vitals   03/16/21 0741  Weight: 120 lb (54.4 kg)  Height: 5\' 8"  (1.727 m)   Body mass index is 18.25 kg/m.  Advanced Directives 03/16/2021 12/21/2019 07/12/2019 06/21/2019 06/18/2019 06/14/2019  Does Patient Have a Medical Advance Directive? No No No No No No  Would patient like information on creating a medical advance directive? Yes (MAU/Ambulatory/Procedural Areas - Information given) Yes (MAU/Ambulatory/Procedural Areas - Information given) Yes (ED - Information included in AVS) Yes (MAU/Ambulatory/Procedural Areas - Information given) - Yes (MAU/Ambulatory/Procedural Areas - Information given)    Current Medications (verified) Outpatient Encounter Medications as of 03/16/2021  Medication Sig   b  complex vitamins capsule Take 1 capsule by mouth every evening.    calcium-vitamin D (OSCAL WITH D) 500-200 MG-UNIT tablet Take 1 tablet by mouth every evening.    latanoprost (XALATAN) 0.005 % ophthalmic solution    levothyroxine (SYNTHROID) 125 MCG tablet Take 1 tablet (125 mcg total) by mouth daily.   metroNIDAZOLE (METROCREAM) 0.75 % cream Apply 1 application topically every evening.   Multiple Vitamin (MULTIVITAMIN WITH MINERALS) TABS tablet Take 1 tablet by mouth every evening.   SULFACLEANSE 8/4 8-4 % SUSP 1 application apply on the skin DAILY  Wash face once a day   No facility-administered encounter medications on file as of 03/16/2021.    Allergies (verified) Patient has no known allergies.   History: Past Medical History:  Diagnosis Date   Endometrial cancer ([place])    Hypothyroid    Personal history of colonic polyps    Postmenopausal bleeding    Uterine cancer [facility])    Past Surgical History:  Procedure Laterality Date   COLONOSCOPY  2007   Dr. Ardis Hughs   ROBOTIC ASSISTED TOTAL HYSTERECTOMY WITH BILATERAL SALPINGO OOPHERECTOMY Bilateral 06/21/2019   Procedure: XI ROBOTIC ASSISTED TOTAL HYSTERECTOMY WITH BILATERAL SALPINGO OOPHORECTOMY, EXCISION OF LEFT ABDOMINAL WALL MASS;  Surgeon: Everitt Amber, MD;  Location: [facility];  Service: Gynecology;  Laterality: Bilateral;   SENTINEL NODE BIOPSY N/A 06/21/2019   Procedure: SENTINEL NODE BIOPSY;  Surgeon: Everitt Amber, MD;  Location: [facility];  Service: Gynecology;  Laterality: N/A;   Family History  Problem Relation Age of Onset   Cancer Mother 53       Breast cancer   Other Mother        polymyositis?  COPD Father    Cancer Brother        Throat cancer   Colon cancer Neg Hx    Endometrial cancer Neg Hx    Ovarian cancer Neg Hx    Social History   Socioeconomic History   Marital status: Married    Spouse name: Not on file   Number of children: Not on file   Years of education: Not on file   Highest education level: Not on  file  Occupational History   Not on file  Tobacco Use   Smoking status: Never   Smokeless tobacco: Never  Vaping Use   Vaping Use: Never used  Substance and Sexual Activity   Alcohol use: No   Drug use: No   Sexual activity: Yes    Birth control/protection: None  Other Topics Concern   Not on file  Social History Narrative   Not on file   Social Determinants of Health   Financial Resource Strain: Low Risk    Difficulty of Paying Living Expenses: Not hard at all  Food Insecurity: No Food Insecurity   Worried About Charity fundraiser in the Last Year: Never true   [place] in the Last Year: Never true  Transportation Needs: No Transportation Needs   Lack of Transportation (Medical): No   Lack of Transportation (Non-Medical): No  Physical Activity: Insufficiently Active   Days of Exercise per Week: 3 days   Minutes of Exercise per Session: 30 min  Stress: No Stress Concern Present   Feeling of Stress : Not at all  Social Connections: Moderately Integrated   Frequency of Communication with Friends and Family: More than three times a week   Frequency of Social Gatherings with Friends and Family: More than three times a week   Attends Religious Services: More than 4 times per year   Active Member of [employer] or Organizations: No   Attends Music therapist: Never   Marital Status: Married    Tobacco Counseling Counseling given: Not Answered   Clinical Intake:  Pre-visit preparation completed: Yes  Pain : No/denies pain     BMI - recorded: 18.25 Nutritional Status: BMI <19  Underweight Nutritional Risks: None Diabetes: No  How often do you need to have someone help you when you read instructions, pamphlets, or other written materials from your doctor or pharmacy?: 1 - Never  Diabetic?No  Interpreter Needed?: No  Information entered by :: Caroleen Hamman LPN   Activities of Daily Living In your present state of health, do you have any  difficulty performing the following activities: 03/16/2021  Hearing? N  Vision? N  Difficulty concentrating or making decisions? N  Walking or climbing stairs? N  Dressing or bathing? N  Doing errands, shopping? N  Preparing Food and eating ? N  Using the Toilet? N  In the past six months, have you accidently leaked urine? N  Do you have problems with loss of bowel control? N  Managing your Medications? N  Managing your Finances? N  Housekeeping or managing your Housekeeping? N  Some recent data might be hidden    Patient Care Team: Copland, Gay Filler, MD as PCP - General (Family Medicine)  Indicate any recent Medical Services you may have received from other than [facility] providers in the past year (date may be approximate).     Assessment:   This is a routine wellness examination for [place].  Hearing/Vision screen Hearing Screening - Comments:: No issues Vision  Screening - Comments:: Last eye exam-12/2020-Dr.Headlee  Dietary issues and exercise activities discussed: Current Exercise Habits: Home exercise routine, Type of exercise: walking, Time (Minutes): 30, Frequency (Times/Week): 3, Weekly Exercise (Minutes/Week): 90, Intensity: Mild, Exercise limited by: None identified   Goals Addressed             This Visit's Progress    Patient Stated       Maintain current healthy lifestyle       Depression Screen PHQ 2/9 Scores 03/16/2021 12/24/2020 06/18/2014 12/29/2012  PHQ - 2 Score 0 0 0 0    Fall Risk Fall Risk  03/16/2021 12/24/2020 06/18/2014  Falls in the past year? 0 0 No  Number falls in past yr: 0 - -  Injury with Fall? 0 - -  Follow up Falls prevention discussed - -    FALL RISK PREVENTION PERTAINING TO THE HOME:  Any stairs in or around the home? Yes  If so, are there any without handrails? No  Home free of loose throw rugs in walkways, pet beds, electrical cords, etc? Yes  Adequate lighting in your home to reduce risk of falls? Yes   ASSISTIVE DEVICES  UTILIZED TO PREVENT FALLS:  Life alert? No  Use of a cane, walker or w/c? No  Grab bars in the bathroom? No  Shower chair or bench in shower? No  Elevated toilet seat or a handicapped toilet? No   TIMED UP AND GO:  Was the test performed? No . Phone visit   Cognitive Function:Normal cognitive status assessed by this Nurse Health Advisor. No abnormalities found.          Immunizations Immunization History  Administered Date(s) Administered   DT (Pediatric) 11/09/2002   Pneumococcal Conjugate-13 06/15/2017   Pneumococcal Polysaccharide-23 10/30/2018   Tdap 06/25/2011    TDAP status: Up to date  Flu Vaccine status: Declined, Education has been provided regarding the importance of this vaccine but patient still declined. Advised may receive this vaccine at local pharmacy or Health Dept. Aware to provide a copy of the vaccination record if obtained from local pharmacy or Health Dept. Verbalized acceptance and understanding.  Pneumococcal vaccine status: Up to date  Covid-19 vaccine status: Declined, Education has been provided regarding the importance of this vaccine but patient still declined. Advised may receive this vaccine at local pharmacy or Health Dept.or vaccine clinic. Aware to provide a copy of the vaccination record if obtained from local pharmacy or Health Dept. Verbalized acceptance and understanding.  Qualifies for Shingles Vaccine? Yes   Zostavax completed No   Shingrix Completed?: No.    Education has been provided regarding the importance of this vaccine. Patient has been advised to call insurance company to determine out of pocket expense if they have not yet received this vaccine. Advised may also receive vaccine at local pharmacy or Health Dept. Verbalized acceptance and understanding.  Screening Tests Health Maintenance  Topic Date Due   COVID-19 Vaccine (1) Never done   Zoster Vaccines- Shingrix (1 of 2) Never done   TETANUS/TDAP  06/24/2021   MAMMOGRAM   01/02/2023   Fecal DNA (Cologuard)  07/15/2023   Pneumonia Vaccine 23+ Years old  Completed   DEXA SCAN  Completed   Hepatitis C Screening  Completed   HPV VACCINES  Aged Out   INFLUENZA VACCINE  Discontinued    Health Maintenance  Health Maintenance Due  Topic Date Due   COVID-19 Vaccine (1) Never done   Zoster Vaccines- Shingrix (1 of 2)  Never done    Colorectal cancer screening: Type of screening: Cologuard. Completed 07/14/2020. Repeat every 3 years  Mammogram status: Completed 01/01/2021-bilateral. Repeat every year  Bone Density status: Completed 01/01/2021. Results reflect: Bone density results: OSTEOPENIA. Repeat every 2 years.  Lung Cancer Screening: (Low Dose CT Chest recommended if Age 33-80 years, 30 pack-year currently smoking OR have quit w/in 15years.) does not qualify.     Additional Screening:  Hepatitis C Screening: Completed 02/17/2015  Vision Screening: Recommended annual ophthalmology exams for early detection of glaucoma and other disorders of the eye. Is the patient up to date with their annual eye exam?  Yes  Who is the provider or what is the name of the office in which the patient attends annual eye exams? Dr. Blair Hailey   Dental Screening: Recommended annual dental exams for proper oral hygiene  Community Resource Referral / Chronic Care Management: CRR required this visit?  No   CCM required this visit?  No      Plan:     I have personally reviewed and noted the following in the patient's chart:   Medical and social history Use of alcohol, tobacco or illicit drugs  Current medications and supplements including opioid prescriptions. Patient is not currently taking opioid prescriptions. Functional ability and status Nutritional status Physical activity Advanced directives List of other physicians Hospitalizations, surgeries, and ER visits in previous 12 months Vitals Screenings to include cognitive, depression, and falls Referrals and  appointments  In addition, I have reviewed and discussed with patient certain preventive protocols, quality metrics, and best practice recommendations. A written personalized care plan for preventive services as well as general preventive health recommendations were provided to patient.   Due to this being a telephonic visit, the after visit summary with patients personalized plan was offered to patient via mail or my-chart. Patient would like to access on my-chart.   Marta Antu, LPN   [id number]  Nurse Health Advisor  Nurse Notes: Verlin Fester

## 2021-03-16 NOTE — Patient Instructions (Signed)
Diane [name] , Thank you for taking time to complete your Medicare Wellness Visit. I appreciate your ongoing commitment to your health goals. Please review the following plan we discussed and let me know if I can assist you in the future.   Screening recommendations/referrals: Colonoscopy: Cologuard completed 07/14/2020-Due 07/15/2023 Mammogram: Completed 01/01/2021-Due 01/01/2022 Bone Density: Completed 01/01/2021-Due 01/02/2023 Recommended yearly ophthalmology/optometry visit for glaucoma screening and checkup Recommended yearly dental visit for hygiene and checkup  Vaccinations: Influenza vaccine: Declined Pneumococcal vaccine: Up to date Tdap vaccine: Up to date-Due-06/24/2021 Shingles vaccine: Discuss with pharmacy   Covid-19:Declined  Advanced directives: Information mailed today  Conditions/risks identified: See problem list  Next appointment: Follow up in one year for your annual wellness visit 03/22/2022 @ 7:40.   Preventive Care 24 Years and Older, Female Preventive care refers to lifestyle choices and visits with your health care provider that can promote health and wellness. What does preventive care include? A yearly physical exam. This is also called an annual well check. Dental exams once or twice a year. Routine eye exams. Ask your health care provider how often you should have your eyes checked. Personal lifestyle choices, including: Daily care of your teeth and gums. Regular physical activity. Eating a healthy diet. Avoiding tobacco and drug use. Limiting alcohol use. Practicing safe sex. Taking low-dose aspirin every day. Taking vitamin and mineral supplements as recommended by your health care provider. What happens during an annual well check? The services and screenings done by your health care provider during your annual well check will depend on your age, overall health, lifestyle risk factors, and family history of disease. Counseling  Your health care provider  may ask you questions about your: Alcohol use. Tobacco use. Drug use. Emotional well-being. Home and relationship well-being. Sexual activity. Eating habits. History of falls. Memory and ability to understand (cognition). Work and work Statistician. Reproductive health. Screening  You may have the following tests or measurements: Height, weight, and BMI. Blood pressure. Lipid and cholesterol levels. These may be checked every 5 years, or more frequently if you are over 71 years old. Skin check. Lung cancer screening. You may have this screening every year starting at age 22 if you have a 30-pack-year history of smoking and currently smoke or have quit within the past 15 years. Fecal occult blood test (FOBT) of the stool. You may have this test every year starting at age 70. Flexible sigmoidoscopy or colonoscopy. You may have a sigmoidoscopy every 5 years or a colonoscopy every 10 years starting at age 61. Hepatitis C blood test. Hepatitis B blood test. Sexually transmitted disease (STD) testing. Diabetes screening. This is done by checking your blood sugar (glucose) after you have not eaten for a while (fasting). You may have this done every 1-3 years. Bone density scan. This is done to screen for osteoporosis. You may have this done starting at age 43. Mammogram. This may be done every 1-2 years. Talk to your health care provider about how often you should have regular mammograms. Talk with your health care provider about your test results, treatment options, and if necessary, the need for more tests. Vaccines  Your health care provider may recommend certain vaccines, such as: Influenza vaccine. This is recommended every year. Tetanus, diphtheria, and acellular pertussis (Tdap, Td) vaccine. You may need a Td booster every 10 years. Zoster vaccine. You may need this after age 55. Pneumococcal 13-valent conjugate (PCV13) vaccine. One dose is recommended after age 62. Pneumococcal  polysaccharide (PPSV23) vaccine.  One dose is recommended after age 15. Talk to your health care provider about which screenings and vaccines you need and how often you need them. This information is not intended to replace advice given to you by your health care provider. Make sure you discuss any questions you have with your health care provider. Document Released: 05/30/2015 Document Revised: 01/21/2016 Document Reviewed: 03/04/2015 Elsevier Interactive Patient Education  2017 [place] Prevention in the Home Falls can cause injuries. They can happen to people of all ages. There are many things you can do to make your home safe and to help prevent falls. What can I do on the outside of my home? Regularly fix the edges of walkways and driveways and fix any cracks. Remove anything that might make you trip as you walk through a door, such as a raised step or threshold. Trim any bushes or trees on the path to your home. Use bright outdoor lighting. Clear any walking paths of anything that might make someone trip, such as rocks or tools. Regularly check to see if handrails are loose or broken. Make sure that both sides of any steps have handrails. Any raised decks and porches should have guardrails on the edges. Have any leaves, snow, or ice cleared regularly. Use sand or salt on walking paths during winter. Clean up any spills in your garage right away. This includes oil or grease spills. What can I do in the bathroom? Use night lights. Install grab bars by the toilet and in the tub and shower. Do not use towel bars as grab bars. Use non-skid mats or decals in the tub or shower. If you need to sit down in the shower, use a plastic, non-slip stool. Keep the floor dry. Clean up any water that spills on the floor as soon as it happens. Remove soap buildup in the tub or shower regularly. Attach bath mats securely with double-sided non-slip rug tape. Do not have throw rugs and other  things on the floor that can make you trip. What can I do in the bedroom? Use night lights. Make sure that you have a light by your bed that is easy to reach. Do not use any sheets or blankets that are too big for your bed. They should not hang down onto the floor. Have a firm chair that has side arms. You can use this for support while you get dressed. Do not have throw rugs and other things on the floor that can make you trip. What can I do in the kitchen? Clean up any spills right away. Avoid walking on wet floors. Keep items that you use a lot in easy-to-reach places. If you need to reach something above you, use a strong step stool that has a grab bar. Keep electrical cords out of the way. Do not use floor polish or wax that makes floors slippery. If you must use wax, use non-skid floor wax. Do not have throw rugs and other things on the floor that can make you trip. What can I do with my stairs? Do not leave any items on the stairs. Make sure that there are handrails on both sides of the stairs and use them. Fix handrails that are broken or loose. Make sure that handrails are as long as the stairways. Check any carpeting to make sure that it is firmly attached to the stairs. Fix any carpet that is loose or worn. Avoid having throw rugs at the top or bottom of the  stairs. If you do have throw rugs, attach them to the floor with carpet tape. Make sure that you have a light switch at the top of the stairs and the bottom of the stairs. If you do not have them, ask someone to add them for you. What else can I do to help prevent falls? Wear shoes that: Do not have high heels. Have rubber bottoms. Are comfortable and fit you well. Are closed at the toe. Do not wear sandals. If you use a stepladder: Make sure that it is fully opened. Do not climb a closed stepladder. Make sure that both sides of the stepladder are locked into place. Ask someone to hold it for you, if possible. Clearly  mark and make sure that you can see: Any grab bars or handrails. First and last steps. Where the edge of each step is. Use tools that help you move around (mobility aids) if they are needed. These include: Canes. Walkers. Scooters. Crutches. Turn on the lights when you go into a dark area. Replace any light bulbs as soon as they burn out. Set up your furniture so you have a clear path. Avoid moving your furniture around. If any of your floors are uneven, fix them. If there are any pets around you, be aware of where they are. Review your medicines with your doctor. Some medicines can make you feel dizzy. This can increase your chance of falling. Ask your doctor what other things that you can do to help prevent falls. This information is not intended to replace advice given to you by your health care provider. Make sure you discuss any questions you have with your health care provider. Document Released: 02/27/2009 Document Revised: 10/09/2015 Document Reviewed: 06/07/2014 Elsevier Interactive Patient Education  2017 [employer].

## 2021-06-19 ENCOUNTER — Encounter: Payer: Self-pay | Admitting: Gynecologic Oncology

## 2021-06-23 ENCOUNTER — Inpatient Hospital Stay: Payer: Medicare HMO | Attending: Gynecologic Oncology | Admitting: Gynecologic Oncology

## 2021-06-23 ENCOUNTER — Other Ambulatory Visit: Payer: Self-pay

## 2021-06-23 VITALS — BP 108/70 | HR 67 | Temp 97.9°F | Resp 16 | Ht 68.11 in | Wt 122.5 lb

## 2021-06-23 DIAGNOSIS — Z8719 Personal history of other diseases of the digestive system: Secondary | ICD-10-CM | POA: Insufficient documentation

## 2021-06-23 DIAGNOSIS — C541 Malignant neoplasm of endometrium: Secondary | ICD-10-CM

## 2021-06-23 DIAGNOSIS — Z8542 Personal history of malignant neoplasm of other parts of uterus: Secondary | ICD-10-CM | POA: Diagnosis not present

## 2021-06-23 DIAGNOSIS — Z90710 Acquired absence of both cervix and uterus: Secondary | ICD-10-CM | POA: Insufficient documentation

## 2021-06-23 DIAGNOSIS — Z90722 Acquired absence of ovaries, bilateral: Secondary | ICD-10-CM | POA: Diagnosis not present

## 2021-06-23 NOTE — Patient Instructions (Signed)
No concerning findings on today's exam. Plan to follow up in six months or sooner if needed. Please call our office in the end of May or June to schedule an appointment for end of July, early August at [phone].  There is a clinic on Feb 13 with openings at 1:30 pm, 2 pm, or 2:30 pm to complete your living will.  Symptoms to report to your health care team include vaginal bleeding, rectal bleeding, bloating, weight loss without effort, new and persistent pain, new and  persistent fatigue, new leg swelling, new masses (i.e., bumps in your neck or groin), new and persistent cough, new and persistent nausea and vomiting, change in bowel or bladder habits, and any other concerns.

## 2021-06-23 NOTE — Progress Notes (Signed)
Gynecologic Oncology Follow-up Visit  Chief Complaint:  Chief Complaint  Patient presents with   Endometrial carcinoma [facility])    Assessment/Plan: Diane [name]  is a 71 y.o. female with stage IA grade 3 endometrioid cancer (MSI stable/MMR proficient). She is s/p robotic assisted laparoscopic staging on 06/21/19 with Dr. Everitt Amber. Pathology revealed low risk factors for recurrence, therefore no adjuvant therapy is recommended according to NCCN guidelines.  No findings on today's exam concerning for recurrence. She is advised to follow up in August 2023 or sooner if needed. Symptoms to report listed in instructions of AVS. She is advised to call for any needs, concerns, or new symptoms.  We continue to recommend she have follow-up every 6 months for 5 years in accordance with NCCN guidelines. Those visits should include symptom assessment, physical exam and pelvic examination. Pap smears are not indicated or recommended in the routine surveillance of endometrial cancer.  HPI: Diane [name] is a 71 year old P3 who was initially seen in consultation at the request of Dr. Ihor Dow for evaluation of grade 3 endometrial cancer.  She developed postmenopausal bleeding in late November 2020 and was seen by her primary care physician. It was determined to be uterine bleeding and she referred her to her gynecologist.  She underwent a transvaginal ultrasound scan on June 06, 2019 which revealed a uterus measuring 6.1 x 3.7 x 5.1 cm with a 22 mm endometrial thickness.  Right and left ovaries were normal.  She then underwent a transvaginal endometrial pipelle biopsy on June 14, 2019, which revealed high-grade endometrial adenocarcinoma with a differential diagnosis including endometrioid or serous carcinoma.  Pap test was normal with negative high-risk HPV performed on October 30, 2018.  The patient underwent a CT scan of the abdomen and pelvis with IV contrast on June 14, 2019 which revealed a  heterogeneous 2.4 cm mass in the fundus of the uterus but no evidence of metastatic disease.  There was some aortic atherosclerosis but no additional findings that were significant.  On 06/26/19, she underwent robotic assisted total hysterectomy, BSO, SLN biopsy with Dr. Everitt Amber. Intraoperative findings were significant for a small normal sized uterus and a left abdominal sebaceous cyst. Surgery was uncomplicated.   Final pathology revealed a FIGO grade 3 stage IA endometrioid endometrial cancer. There was inner half myo invasion (2 of 83mm). There was no LVSI, no cervical/adnexal/nodal inolvement. The tumor was MSI stable/MMR proficient. She met low risk factors for recurrence and therefore no adjuvant therapy was recommended in accordance with NCCN guidelines.  Interval History: She presents today for continued follow up. She has been doing well since her last visit. 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 There have been no changes in her health. She stays very active with her occupation and family. No symptoms of recurrence voiced including vaginal bleeding, change in bowel or bladder habits, new pains. She reports a change in her vision which was related to pressure in her eyes and this is being managed. She had a mammogram in August 2022 and performed a cologuard in February 2022. She continues to have regular follow up visits with her PCP. No concerns voiced.   Current Meds:  Outpatient Encounter Medications as of 06/23/2021  Medication Sig   b complex vitamins capsule Take 1 capsule by mouth every evening.    calcium-vitamin D (OSCAL WITH D) 500-200 MG-UNIT tablet Take 1 tablet by mouth every evening.    latanoprost (XALATAN) 0.005 %  ophthalmic solution    levothyroxine (SYNTHROID) 125 MCG tablet Take 1 tablet (125 mcg total) by mouth daily.   metroNIDAZOLE (METROCREAM) 0.75 % cream Apply 1 application topically every evening.   Multiple Vitamin  (MULTIVITAMIN WITH MINERALS) TABS tablet Take 1 tablet by mouth every evening.   SULFACLEANSE 8/4 8-4 % SUSP 1 application apply on the skin DAILY  Wash face once a day   No facility-administered encounter medications on file as of 06/23/2021.    Allergy: No Known Allergies  Social Hx:   Social History   Socioeconomic History   Marital status: Married    Spouse name: Not on file   Number of children: Not on file   Years of education: Not on file   Highest education level: Not on file  Occupational History   Not on file  Tobacco Use   Smoking status: Never   Smokeless tobacco: Never  Vaping Use   Vaping Use: Never used  Substance and Sexual Activity   Alcohol use: No   Drug use: No   Sexual activity: Yes    Birth control/protection: None  Other Topics Concern   Not on file  Social History Narrative   Not on file   Social Determinants of Health   Financial Resource Strain: Low Risk    Difficulty of Paying Living Expenses: Not hard at all  Food Insecurity: No Food Insecurity   Worried About Charity fundraiser in the Last Year: Never true   [place] in the Last Year: Never true  Transportation Needs: No Transportation Needs   Lack of Transportation (Medical): No   Lack of Transportation (Non-Medical): No  Physical Activity: Insufficiently Active   Days of Exercise per Week: 3 days   Minutes of Exercise per Session: 30 min  Stress: No Stress Concern Present   Feeling of Stress : Not at all  Social Connections: Moderately Integrated   Frequency of Communication with Friends and Family: More than three times a week   Frequency of Social Gatherings with Friends and Family: More than three times a week   Attends Religious Services: More than 4 times per year   Active Member of Clubs or Organizations: No   Attends Archivist Meetings: Never   Marital Status: Married  Human resources officer Violence: Not At Risk   Fear of Current or Ex-Partner: No    Emotionally Abused: No   Physically Abused: No   Sexually Abused: No    Past Surgical Hx:  Past Surgical History:  Procedure Laterality Date   COLONOSCOPY  2007   Dr. Ardis Hughs   ROBOTIC ASSISTED TOTAL HYSTERECTOMY WITH BILATERAL SALPINGO OOPHERECTOMY Bilateral 06/21/2019   Procedure: XI ROBOTIC ASSISTED TOTAL HYSTERECTOMY WITH BILATERAL SALPINGO OOPHORECTOMY, EXCISION OF LEFT ABDOMINAL WALL MASS;  Surgeon: Everitt Amber, MD;  Location: [facility];  Service: Gynecology;  Laterality: Bilateral;   SENTINEL NODE BIOPSY N/A 06/21/2019   Procedure: SENTINEL NODE BIOPSY;  Surgeon: Everitt Amber, MD;  Location: [facility];  Service: Gynecology;  Laterality: N/A;    Past Medical Hx:  Past Medical History:  Diagnosis Date   Endometrial cancer ([place])    Hypothyroid    Personal history of colonic polyps    Postmenopausal bleeding    Uterine cancer ([place])     Past Gynecological History:  See HPI No LMP recorded. Patient is postmenopausal.  Family Hx:  Family History  Problem Relation Age of Onset   Cancer Mother 47  Breast cancer   Other Mother        polymyositis?   COPD Father    Cancer Brother        Throat cancer   Colon cancer Neg Hx    Endometrial cancer Neg Hx    Ovarian cancer Neg Hx    Review of Systems: Constitutional: Feels well. No fever, chills, early satiety, unintentional weight loss or gain. Cardiovascular: No chest pain, shortness of breath, or edema.  Pulmonary: No cough or wheeze.  Gastrointestinal: No nausea, vomiting, or diarrhea. No bright red blood per rectum or change in bowel movement.  Genitourinary: No frequency, urgency, or dysuria. No vaginal bleeding or discharge.  Musculoskeletal: No new myalgia or joint pain. Neurologic: No new weakness, numbness, or change in gait.  Psychology: No depression, anxiety, or insomnia.  Health Maintenance: Mammogram: August 2022 Pap Smear: N/A Cologuard: February 2022 Bone Density Scan: August 2022  Vitals:  Blood pressure  108/70, pulse 67, temperature 97.9 F (36.6 C), temperature source Oral, resp. rate 16, height 5' 8.11" (1.73 m), weight 122 lb 8 oz (55.6 kg), SpO2 99 %.  Physical Exam: General: Well developed, well nourished female in no acute distress. Alert and oriented x 3.  Neck: Supple without any enlargements.  Lymph node survey: No cervical, supraclavicular, or inguinal adenopathy.  Cardiovascular: Regular rate and rhythm. S1 and S2 normal.  Lungs: Clear to auscultation bilaterally. No wheezes/crackles/rhonchi noted.  Skin: No rashes or lesions present. Back: No CVA tenderness.  Abdomen: Abdomen soft, non-tender and non-obese. Active bowel sounds in all quadrants. No evidence of a fluid wave or abdominal masses. Incisions well healed without nodularity noted on palpation. Genitourinary:    Vulva/vagina: Normal external female genitalia. No lesions.    Urethra: No lesions or masses.    Vagina: Vaginal cuff well healed and smooth. No palpable masses. No vaginal bleeding or drainage noted. Cervix and uterus surgically absent. Rectal: Deferred, pt declined.  Extremities: No bilateral cyanosis, edema, or clubbing.    Dorothyann Gibbs, NP  06/26/2021, 7:10 PM

## 2021-06-26 NOTE — Progress Notes (Deleted)
[place] at [facility] [address], [place], [place] [address] [phone]  Date:  06/29/2021   Name:  Diane [name]   DOB:  1950/07/17   MRN:  [id number]  PCP:  Darreld Mclean, MD    Chief Complaint: No chief complaint on file.   History of Present Illness:  Diane [name] is a 71 y.o. very pleasant female patient who presents with the following:  Pt seen today for follow-up Last seen by myself 8/22 History of osteopenia, hypothyroidism, endometrial carcinoma She underwent a total hysterectomy and resection of abdominal wall mass in February of last year due to endometrial carcinoma  She has refused flu and covid vaccination   Her endometrial cancer is followed by Dr Denman George She is s/p robotic assisted laparoscopic staging on 06/21/19 with Dr. Everitt Amber. Pathology revealed low risk factors for recurrence, therefore no adjuvant therapy is recommended according to NCCN guidelines. We continue to recommend she have follow-up every 6 months for 5 years in accordance with NCCN guidelines. Those visits should include symptom assessment, physical exam and pelvic examination. Pap smears are not indicated or recommended in the routine surveillance of endometrial cancer.  We did adjust her thyroid level last year as her TSH was quite low and she was losing weight  Tetanus is due Shingrix  Dexa and mammo are UTD Labs one year ago except more recent TSH- update today  Patient Active Problem List   Diagnosis Date Noted   Abdominal wall mass 06/21/2019   Endometrial carcinoma ([place]) 06/14/2019   Osteopenia 12/01/2018   Hypothyroid 12/29/2012    Past Medical History:  Diagnosis Date   Endometrial cancer ([place])    Hypothyroid    Personal history of colonic polyps    Postmenopausal bleeding    Uterine cancer [facility])     Past Surgical History:  Procedure Laterality Date   COLONOSCOPY  2007   Dr. Ardis Hughs   ROBOTIC ASSISTED TOTAL  HYSTERECTOMY WITH BILATERAL SALPINGO OOPHERECTOMY Bilateral 06/21/2019   Procedure: XI ROBOTIC ASSISTED TOTAL HYSTERECTOMY WITH BILATERAL SALPINGO OOPHORECTOMY, EXCISION OF LEFT ABDOMINAL WALL MASS;  Surgeon: Everitt Amber, MD;  [facility];  Service: Gynecology;  Laterality: Bilateral;   SENTINEL NODE BIOPSY N/A 06/21/2019   Procedure: SENTINEL NODE BIOPSY;  Surgeon: Everitt Amber, MD;  [facility]: [facility];  Service: Gynecology;  Laterality: N/A;    Social History   Tobacco Use   Smoking status: Never   Smokeless tobacco: Never  Vaping Use   Vaping Use: Never used  Substance Use Topics   Alcohol use: No   Drug use: No    Family History  Problem Relation Age of Onset   Cancer Mother 42       Breast cancer   Other Mother        polymyositis?   COPD Father    Cancer Brother        Throat cancer   Colon cancer Neg Hx    Endometrial cancer Neg Hx    Ovarian cancer Neg Hx     No Known Allergies  Medication list has been reviewed and updated.  Current Outpatient Medications on File Prior to Visit  Medication Sig Dispense Refill   b complex vitamins capsule Take 1 capsule by mouth every evening.      calcium-vitamin D (OSCAL WITH D) 500-200 MG-UNIT tablet Take 1 tablet by mouth every evening.      latanoprost (XALATAN) 0.005 %  ophthalmic solution      levothyroxine (SYNTHROID) 125 MCG tablet Take 1 tablet (125 mcg total) by mouth daily. 30 tablet 6   metroNIDAZOLE (METROCREAM) 0.75 % cream Apply 1 application topically every evening.     Multiple Vitamin (MULTIVITAMIN WITH MINERALS) TABS tablet Take 1 tablet by mouth every evening.     SULFACLEANSE 8/4 8-4 % SUSP 1 application apply on the skin DAILY  Wash face once a day     No current facility-administered medications on file prior to visit.    Review of Systems:  As per HPI- otherwise negative.   Physical Examination: There were no vitals filed for this visit. There were no vitals filed for this visit. There is no  height or weight on file to calculate BMI. Ideal Body Weight:    GEN: no acute distress. HEENT: Atraumatic, Normocephalic.  Ears and Nose: No external deformity. CV: RRR, No M/G/R. No JVD. No thrill. No extra heart sounds. PULM: CTA B, no wheezes, crackles, rhonchi. No retractions. No resp. distress. No accessory muscle use. ABD: S, NT, ND, +BS. No rebound. No HSM. EXTR: No c/c/e PSYCH: Normally interactive. Conversant.    Assessment and Plan: ***  Signed Lamar Blinks, MD

## 2021-06-26 NOTE — Patient Instructions (Addendum)
It was good to see you again today- I will be in touch with your labs  Tetanus today  I would suggest getting the shingles vaccine series at the pharmacy at your convenience

## 2021-06-29 ENCOUNTER — Ambulatory Visit (INDEPENDENT_AMBULATORY_CARE_PROVIDER_SITE_OTHER): Payer: Medicare HMO | Admitting: Family Medicine

## 2021-06-29 VITALS — BP 110/60 | HR 61 | Temp 98.0°F | Resp 18 | Ht 68.0 in | Wt 123.0 lb

## 2021-06-29 DIAGNOSIS — Z13220 Encounter for screening for lipoid disorders: Secondary | ICD-10-CM

## 2021-06-29 DIAGNOSIS — E875 Hyperkalemia: Secondary | ICD-10-CM

## 2021-06-29 DIAGNOSIS — S81801A Unspecified open wound, right lower leg, initial encounter: Secondary | ICD-10-CM | POA: Diagnosis not present

## 2021-06-29 DIAGNOSIS — Z130 Encounter for screening for diseases of the blood and blood-forming organs and certain disorders involving the immune mechanism: Secondary | ICD-10-CM | POA: Diagnosis not present

## 2021-06-29 DIAGNOSIS — R5383 Other fatigue: Secondary | ICD-10-CM

## 2021-06-29 DIAGNOSIS — C541 Malignant neoplasm of endometrium: Secondary | ICD-10-CM | POA: Diagnosis not present

## 2021-06-29 DIAGNOSIS — E039 Hypothyroidism, unspecified: Secondary | ICD-10-CM

## 2021-06-29 DIAGNOSIS — R636 Underweight: Secondary | ICD-10-CM

## 2021-06-29 DIAGNOSIS — Z131 Encounter for screening for diabetes mellitus: Secondary | ICD-10-CM

## 2021-06-29 DIAGNOSIS — Z23 Encounter for immunization: Secondary | ICD-10-CM | POA: Diagnosis not present

## 2021-06-29 LAB — CBC
HCT: 41.6 % (ref 36.0–46.0)
Hemoglobin: 13.7 g/dL (ref 12.0–15.0)
MCHC: 32.8 g/dL (ref 30.0–36.0)
MCV: 99.4 fl (ref 78.0–100.0)
Platelets: 215 10*3/uL (ref 150.0–400.0)
RBC: 4.19 Mil/uL (ref 3.87–5.11)
RDW: 13.9 % (ref 11.5–15.5)
WBC: 4 10*3/uL (ref 4.0–10.5)

## 2021-06-29 LAB — HEMOGLOBIN A1C: Hgb A1c MFr Bld: 5.7 % (ref 4.6–6.5)

## 2021-06-29 LAB — TSH: TSH: 1.31 u[IU]/mL (ref 0.35–5.50)

## 2021-06-29 LAB — VITAMIN D 25 HYDROXY (VIT D DEFICIENCY, FRACTURES): VITD: 32.02 ng/mL (ref 30.00–100.00)

## 2021-06-29 NOTE — Progress Notes (Addendum)
[place] at [facility] [address], [place], [place] [address] [phone]  Date:  06/29/2021   Name:  Diane [name]   DOB:  [birth year]   MRN:  [id number]  PCP:  Darreld Mclean, MD    Chief Complaint: Follow-up (Concerns/ questions: none/Tdap and zoster due: none in ncir)   History of Present Illness:  Diane [name] is a 71 y.o. very pleasant female patient who presents with the following:  Pt seen today for follow-up Last seen by myself 8/22 History of osteopenia, hypothyroidism, endometrial carcinoma She underwent a total hysterectomy and resection of abdominal wall mass in February of last year due to endometrial carcinoma  She has refused flu and covid vaccination   Her endometrial cancer is followed by Dr Denman George- however she is now followed by Joylene John- NP She is s/p robotic assisted laparoscopic staging on 06/21/19 with Dr. Everitt Amber. Pathology revealed low risk factors for recurrence, therefore no adjuvant therapy is recommended according to NCCN guidelines. We continue to recommend she have follow-up every 6 months for 5 years in accordance with NCCN guidelines. Those visits should include symptom assessment, physical exam and pelvic examination. Pap smears are not indicated or recommended in the routine surveillance of endometrial cancer.  We did adjust her thyroid level last year as her TSH was quite low and she was losing weight  Tetanus is due- update today  Shingrix -recommend Dexa and mammo are UTD Labs one year ago except more recent TSH- update today  Cologuard UTD  She notes she is feeling well Her family is doing well  Wt Readings from Last 3 Encounters:  06/29/21 123 lb (55.8 kg)  06/23/21 122 lb 8 oz (55.6 kg)  03/16/21 120 lb (54.4 kg)    Patient Active Problem List   Diagnosis Date Noted   Abdominal wall mass 06/21/2019   Endometrial carcinoma ([place]) 06/14/2019   Osteopenia 12/01/2018    Hypothyroid 12/29/2012    Past Medical History:  Diagnosis Date   Endometrial cancer ([place])    Hypothyroid    Personal history of colonic polyps    Postmenopausal bleeding    Uterine cancer ([place])     Past Surgical History:  Procedure Laterality Date   COLONOSCOPY  2007   Dr. Ardis Hughs   ROBOTIC ASSISTED TOTAL HYSTERECTOMY WITH BILATERAL SALPINGO OOPHERECTOMY Bilateral 06/21/2019   Procedure: XI ROBOTIC ASSISTED TOTAL HYSTERECTOMY WITH BILATERAL SALPINGO OOPHORECTOMY, EXCISION OF LEFT ABDOMINAL WALL MASS;  Surgeon: Everitt Amber, MD;  [facility];  Service: Gynecology;  Laterality: Bilateral;   SENTINEL NODE BIOPSY N/A 06/21/2019   Procedure: SENTINEL NODE BIOPSY;  Surgeon: Everitt Amber, MD;  Location: [facility];  Service: Gynecology;  Laterality: N/A;    Social History   Tobacco Use   Smoking status: Never   Smokeless tobacco: Never  Vaping Use   Vaping Use: Never used  Substance Use Topics   Alcohol use: No   Drug use: No    Family History  Problem Relation Age of Onset   Cancer Mother 81       Breast cancer   Other Mother        polymyositis?   COPD Father    Cancer Brother        Throat cancer   Colon cancer Neg Hx    Endometrial cancer Neg Hx    Ovarian cancer Neg Hx     No Known Allergies  Medication list  has been reviewed and updated.  Current Outpatient Medications on File Prior to Visit  Medication Sig Dispense Refill   b complex vitamins capsule Take 1 capsule by mouth every evening.      calcium-vitamin D (OSCAL WITH D) 500-200 MG-UNIT tablet Take 1 tablet by mouth every evening.      latanoprost (XALATAN) 0.005 % ophthalmic solution      levothyroxine (SYNTHROID) 125 MCG tablet Take 1 tablet (125 mcg total) by mouth daily. 30 tablet 6   metroNIDAZOLE (METROCREAM) 0.75 % cream Apply 1 application topically every evening.     Multiple Vitamin (MULTIVITAMIN WITH MINERALS) TABS tablet Take 1 tablet by mouth every evening.     SULFACLEANSE 8/4 8-4 % SUSP 1  application apply on the skin DAILY  Wash face once a day     No current facility-administered medications on file prior to visit.    Review of Systems:  As per HPI- otherwise negative.   Physical Examination: Vitals:   06/29/21 0917  BP: 110/60  Pulse: 61  Resp: 18  Temp: 98 F (36.7 C)  SpO2: 97%   Vitals:   06/29/21 0917  Weight: 123 lb (55.8 kg)  Height: 5\' 8"  (1.727 m)   Body mass index is 18.7 kg/m. Ideal Body Weight: Weight in (lb) to have BMI = 25: 164.1  GEN: no acute distress.  Slight build, looks well HEENT: Atraumatic, Normocephalic.  Ears and Nose: No external deformity. CV: RRR, No M/G/R. No JVD. No thrill. No extra heart sounds. PULM: CTA B, no wheezes, crackles, rhonchi. No retractions. No resp. distress. No accessory muscle use. ABD: S, NT, ND,. No rebound. No HSM. EXTR: No c/c/e PSYCH: Normally interactive. Conversant.  Patient has a small abrasion on her right lower extremity, no wound care is needed but will update tetanus  Assessment and Plan: Screening for diabetes mellitus - Plan: Comprehensive metabolic panel, Hemoglobin A1c  Underweight  Screening for hyperlipidemia - Plan: Lipid panel  Endometrial carcinoma (HCC)  Hypothyroidism, unspecified type - Plan: TSH  Screening for deficiency anemia - Plan: CBC  Fatigue, unspecified type - Plan: TSH, VITAMIN D 25 Hydroxy (Vit-D Deficiency, Fractures)  Immunization due - Plan: Td vaccine greater than or equal to 7yo preservative free IM  Wound of right lower extremity, initial encounter - Plan: Td vaccine greater than or equal to 7yo preservative free IM  Following up today.  Updated tetanus Labs are pending as above Weight is stable, up just a few pounds.  Check TSH  Signed Lamar Blinks, MD  Received labs 2/14 - message to pt  Results for orders placed or performed in visit on 06/29/21  CBC  Result Value Ref Range   WBC 4.0 4.0 - 10.5 K/uL   RBC 4.19 3.87 - 5.11 Mil/uL    Platelets 215.0 150.0 - 400.0 K/uL   Hemoglobin 13.7 12.0 - 15.0 g/dL   HCT 41.6 36.0 - 46.0 %   MCV 99.4 78.0 - 100.0 fl   MCHC 32.8 30.0 - 36.0 g/dL   RDW 13.9 11.5 - 15.5 %  Comprehensive metabolic panel  Result Value Ref Range   Sodium 141 135 - 145 mEq/L   Potassium 5.4 (H) 3.5 - 5.1 mEq/L   Chloride 107 96 - 112 mEq/L   CO2 32 19 - 32 mEq/L   Glucose, Bld 90 70 - 99 mg/dL   BUN 18 6 - 23 mg/dL   Creatinine, Ser 0.89 0.40 - 1.20 mg/dL   Total Bilirubin 0.3  0.2 - 1.2 mg/dL   Alkaline Phosphatase 58 39 - 117 U/L   AST 22 0 - 37 U/L   ALT 24 0 - 35 U/L   Total Protein 6.6 6.0 - 8.3 g/dL   Albumin 4.4 3.5 - 5.2 g/dL   GFR 66.26 >60.00 mL/min   Calcium 9.8 8.4 - 10.5 mg/dL  Hemoglobin A1c  Result Value Ref Range   Hgb A1c MFr Bld 5.7 4.6 - 6.5 %  Lipid panel  Result Value Ref Range   Cholesterol 202 (H) 0 - 200 mg/dL   Triglycerides 80.0 0.0 - 149.0 mg/dL   HDL 91.50 >39.00 mg/dL   VLDL 16.0 0.0 - 40.0 mg/dL   LDL Cholesterol 95 0 - 99 mg/dL   Total CHOL/HDL Ratio 2    NonHDL 110.52   TSH  Result Value Ref Range   TSH 1.31 0.35 - 5.50 uIU/mL  VITAMIN D 25 Hydroxy (Vit-D Deficiency, Fractures)  Result Value Ref Range   VITD 32.02 30.00 - 100.00 ng/mL

## 2021-06-30 ENCOUNTER — Encounter: Payer: Self-pay | Admitting: Family Medicine

## 2021-06-30 LAB — COMPREHENSIVE METABOLIC PANEL
ALT: 24 U/L (ref 0–35)
AST: 22 U/L (ref 0–37)
Albumin: 4.4 g/dL (ref 3.5–5.2)
Alkaline Phosphatase: 58 U/L (ref 39–117)
BUN: 18 mg/dL (ref 6–23)
CO2: 32 mEq/L (ref 19–32)
Calcium: 9.8 mg/dL (ref 8.4–10.5)
Chloride: 107 mEq/L (ref 96–112)
Creatinine, Ser: 0.89 mg/dL (ref 0.40–1.20)
GFR: 66.26 mL/min (ref >60.00)
Glucose, Bld: 90 mg/dL (ref 70–99)
Potassium: 5.4 mEq/L — ABNORMAL HIGH (ref 3.5–5.1)
Sodium: 141 mEq/L (ref 135–145)
Total Bilirubin: 0.3 mg/dL (ref 0.2–1.2)
Total Protein: 6.6 g/dL (ref 6.0–8.3)

## 2021-06-30 LAB — LIPID PANEL
Cholesterol: 202 mg/dL — ABNORMAL HIGH (ref 0–200)
HDL: 91.5 mg/dL (ref >39.00)
LDL Cholesterol: 95 mg/dL (ref 0–99)
NonHDL: 110.52
Total CHOL/HDL Ratio: 2
Triglycerides: 80 mg/dL (ref 0.0–149.0)
VLDL: 16 mg/dL (ref 0.0–40.0)

## 2021-06-30 NOTE — Addendum Note (Signed)
Addended by: Lamar Blinks C on: 06/30/2021 07:15 PM   Modules accepted: Orders

## 2021-07-19 ENCOUNTER — Other Ambulatory Visit: Payer: Self-pay | Admitting: Family Medicine

## 2021-07-19 DIAGNOSIS — E039 Hypothyroidism, unspecified: Secondary | ICD-10-CM

## 2021-07-22 ENCOUNTER — Other Ambulatory Visit (INDEPENDENT_AMBULATORY_CARE_PROVIDER_SITE_OTHER): Payer: Medicare HMO

## 2021-07-22 ENCOUNTER — Encounter: Payer: Self-pay | Admitting: Family Medicine

## 2021-07-22 DIAGNOSIS — E875 Hyperkalemia: Secondary | ICD-10-CM

## 2021-07-22 LAB — POTASSIUM: Potassium: 4.6 mEq/L (ref 3.5–5.1)

## 2022-01-08 ENCOUNTER — Encounter: Payer: Self-pay | Admitting: Gynecologic Oncology

## 2022-01-10 NOTE — Progress Notes (Unsigned)
[place] at [facility] [address], [place], [place] [address] [phone]  Date:  01/11/2022   Name:  Diane [name]   DOB:  [birth year]   MRN:  [id number]  PCP:  Darreld Mclean, MD    Chief Complaint: No chief complaint on file.   History of Present Illness:  Diane [name] is a 71 y.o. very pleasant female patient who presents with the following:  Patient seen today for periodic follow-up visit Most recent visit with myself was in February History of osteopenia, hypothyroidism, endometrial carcinoma-February 2022 she underwent total hysterectomy and resection of abdominal wall mass  She was seen by her gynecologic oncologist in February  She does not need Pap screening She has declined flu and COVID-19 vaccination Can suggest Shingrix Mammogram appears to be due Cologuard is up-to-date Lab work done in [facility] was a bit high, normalized on recheck Patient Active Problem List   Diagnosis Date Noted   Abdominal wall mass 06/21/2019   Endometrial carcinoma ([place]) 06/14/2019   Osteopenia 12/01/2018   Hypothyroid 12/29/2012    Past Medical History:  Diagnosis Date   Endometrial cancer ([place])    Hypothyroid    Personal history of colonic polyps    Postmenopausal bleeding    Uterine cancer [facility])     Past Surgical History:  Procedure Laterality Date   COLONOSCOPY  2007   Dr. Ardis Hughs   ROBOTIC ASSISTED TOTAL HYSTERECTOMY WITH BILATERAL SALPINGO OOPHERECTOMY Bilateral 06/21/2019   Procedure: XI ROBOTIC ASSISTED TOTAL HYSTERECTOMY WITH BILATERAL SALPINGO OOPHORECTOMY, EXCISION OF LEFT ABDOMINAL WALL MASS;  Surgeon: Everitt Amber, MD;  Location: [facility];  Service: Gynecology;  Laterality: Bilateral;   SENTINEL NODE BIOPSY N/A 06/21/2019   Procedure: SENTINEL NODE BIOPSY;  Surgeon: Everitt Amber, MD;  Location: [facility];  Service: Gynecology;  Laterality: N/A;    Social History   Tobacco Use   Smoking status: Never    Smokeless tobacco: Never  Vaping Use   Vaping Use: Never used  Substance Use Topics   Alcohol use: No   Drug use: No    Family History  Problem Relation Age of Onset   Cancer Mother 35       Breast cancer   Other Mother        polymyositis?   COPD Father    Cancer Brother        Throat cancer   Colon cancer Neg Hx    Endometrial cancer Neg Hx    Ovarian cancer Neg Hx     No Known Allergies  Medication list has been reviewed and updated.  Current Outpatient Medications on File Prior to Visit  Medication Sig Dispense Refill   b complex vitamins capsule Take 1 capsule by mouth every evening.      calcium-vitamin D (OSCAL WITH D) 500-200 MG-UNIT tablet Take 1 tablet by mouth every evening.      latanoprost (XALATAN) 0.005 % ophthalmic solution      levothyroxine (SYNTHROID) 125 MCG tablet TAKE 1 TABLET BY MOUTH EVERY DAY 90 tablet 1   metroNIDAZOLE (METROCREAM) 0.75 % cream Apply 1 application topically every evening.     Multiple Vitamin (MULTIVITAMIN WITH MINERALS) TABS tablet Take 1 tablet by mouth every evening.     SULFACLEANSE 8/4 8-4 % SUSP 1 application apply on the skin DAILY  Wash face once a day     No current facility-administered medications on file prior to visit.  Review of Systems:  As per HPI- otherwise negative.   Physical Examination: There were no vitals filed for this visit. There were no vitals filed for this visit. There is no height or weight on file to calculate BMI. Ideal Body Weight:    GEN: no acute distress. HEENT: Atraumatic, Normocephalic.  Ears and Nose: No external deformity. CV: RRR, No M/G/R. No JVD. No thrill. No extra heart sounds. PULM: CTA B, no wheezes, crackles, rhonchi. No retractions. No resp. distress. No accessory muscle use. ABD: S, NT, ND, +BS. No rebound. No HSM. EXTR: No c/c/e PSYCH: Normally interactive. Conversant.    Assessment and Plan: ***  Signed Lamar Blinks, MD

## 2022-01-10 NOTE — Patient Instructions (Incomplete)
It was good to see you again today I would suggest getting the Shingrix vaccine series at your convenience I also recommend routine seasonal flu shots Please see me in about 6 months for your next routine visit and labs Ordered your mammogram today - to be done at your convenience here at the [facility]

## 2022-01-11 ENCOUNTER — Ambulatory Visit (INDEPENDENT_AMBULATORY_CARE_PROVIDER_SITE_OTHER): Payer: Medicare HMO | Admitting: Family Medicine

## 2022-01-11 ENCOUNTER — Inpatient Hospital Stay: Payer: Medicare HMO | Attending: Gynecologic Oncology | Admitting: Gynecologic Oncology

## 2022-01-11 ENCOUNTER — Ambulatory Visit (HOSPITAL_BASED_OUTPATIENT_CLINIC_OR_DEPARTMENT_OTHER)
Admission: RE | Admit: 2022-01-11 | Discharge: 2022-01-11 | Disposition: A | Discharge status: Home / Self Care | Payer: Medicare HMO | Source: Ambulatory Visit | Attending: Family Medicine | Admitting: Family Medicine

## 2022-01-11 ENCOUNTER — Encounter (HOSPITAL_BASED_OUTPATIENT_CLINIC_OR_DEPARTMENT_OTHER): Payer: Self-pay

## 2022-01-11 ENCOUNTER — Other Ambulatory Visit: Payer: Self-pay

## 2022-01-11 VITALS — BP 118/60 | HR 60 | Temp 97.8°F | Resp 18 | Ht 65.0 in | Wt 124.8 lb

## 2022-01-11 VITALS — BP 105/70 | HR 70 | Temp 98.3°F | Resp 20 | Ht 65.0 in | Wt 125.0 lb

## 2022-01-11 DIAGNOSIS — Z1231 Encounter for screening mammogram for malignant neoplasm of breast: Secondary | ICD-10-CM

## 2022-01-11 DIAGNOSIS — Z90710 Acquired absence of both cervix and uterus: Secondary | ICD-10-CM | POA: Diagnosis not present

## 2022-01-11 DIAGNOSIS — E039 Hypothyroidism, unspecified: Secondary | ICD-10-CM | POA: Diagnosis not present

## 2022-01-11 DIAGNOSIS — Z8542 Personal history of malignant neoplasm of other parts of uterus: Secondary | ICD-10-CM

## 2022-01-11 DIAGNOSIS — Z90722 Acquired absence of ovaries, bilateral: Secondary | ICD-10-CM | POA: Insufficient documentation

## 2022-01-11 DIAGNOSIS — C541 Malignant neoplasm of endometrium: Secondary | ICD-10-CM

## 2022-01-11 NOTE — Progress Notes (Signed)
Gynecologic Oncology Follow-up Visit  Chief Complaint:  Chief Complaint  Patient presents with   Endometrial carcinoma [facility])    Assessment/Plan: Diane [name]  is a 71 y.o. female with stage IA grade 3 endometrioid cancer (MSI stable/MMR proficient). She is s/p robotic assisted laparoscopic staging on 06/21/19 with Dr. Everitt Amber. Pathology revealed low risk factors for recurrence, therefore no adjuvant therapy is recommended according to NCCN guidelines.  No findings on today's exam concerning for recurrence. She is advised to follow up in February 2024 or sooner if needed. Symptoms to report listed in instructions of AVS. She is advised to call for any needs, concerns, or new symptoms.  We continue to recommend she have follow-up every 6 months for 5 years in accordance with NCCN guidelines. Those visits should include symptom assessment, physical exam and pelvic examination. Pap smears are not indicated or recommended in the routine surveillance of endometrial cancer.  HPI: Diane [name] is a 71 year old P3 who was initially seen in consultation at the request of Dr. Ihor Dow for evaluation of grade 3 endometrial cancer. She developed postmenopausal bleeding in late November 2020 and was seen by her primary care physician. It was determined to be uterine bleeding and she referred her to her gynecologist. She underwent a transvaginal ultrasound scan on June 06, 2019 which revealed a uterus measuring 6.1 x 3.7 x 5.1 cm with a 22 mm endometrial thickness. Right and left ovaries were normal.  She then underwent a transvaginal endometrial pipelle biopsy on June 14, 2019, which revealed high-grade endometrial adenocarcinoma with a differential diagnosis including endometrioid or serous carcinoma. Pap test was normal with negative high-risk HPV performed on October 30, 2018.  The patient underwent a CT scan of the abdomen and pelvis with IV contrast on June 14, 2019 which revealed a  heterogeneous 2.4 cm mass in the fundus of the uterus but no evidence of metastatic disease.  There was some aortic atherosclerosis but no additional findings that were significant.  On 06/26/19, she underwent robotic assisted total hysterectomy, BSO, SLN biopsy with Dr. Everitt Amber. Intraoperative findings were significant for a small normal sized uterus and a left abdominal sebaceous cyst. Surgery was uncomplicated.   Final pathology revealed a FIGO grade 3 stage IA endometrioid endometrial cancer. There was inner half myo invasion (2 of 22mm). There was no LVSI, no cervical/adnexal/nodal inolvement. The tumor was MSI stable/MMR proficient. She met low risk factors for recurrence and therefore no adjuvant therapy was recommended in accordance with NCCN guidelines.  Interval History: She presents today for continued follow up. She has been doing well since her last visit. 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 There have been no changes in her health. She continues to stay very active with her occupation and family. No symptoms of recurrence voiced including vaginal bleeding, change in bowel or bladder habits, new abdominal/pelvic pain. She saw her PCP earlier today for a check up and has a mammogram scheduled for this afternoon. No concerns voiced.   Current Meds:  Outpatient Encounter Medications as of 01/11/2022  Medication Sig   b complex vitamins capsule Take 1 capsule by mouth every evening.    calcium-vitamin D (OSCAL WITH D) 500-200 MG-UNIT tablet Take 1 tablet by mouth every evening.    latanoprost (XALATAN) 0.005 % ophthalmic solution    levothyroxine (SYNTHROID) 125 MCG tablet TAKE 1 TABLET BY MOUTH EVERY DAY   metroNIDAZOLE (METROCREAM) 0.75 % cream Apply 1 application topically every  evening.   Multiple Vitamin (MULTIVITAMIN WITH MINERALS) TABS tablet Take 1 tablet by mouth every evening.   SULFACLEANSE 8/4 8-4 % SUSP 1 application apply on the skin  DAILY  Wash face once a day   No facility-administered encounter medications on file as of 01/11/2022.    Allergy: No Known Allergies  Social Hx:   Social History   Socioeconomic History   Marital status: Married    Spouse name: Not on file   Number of children: Not on file   Years of education: Not on file   Highest education level: Not on file  Occupational History   Not on file  Tobacco Use   Smoking status: Never   Smokeless tobacco: Never  Vaping Use   Vaping Use: Never used  Substance and Sexual Activity   Alcohol use: No   Drug use: No   Sexual activity: Yes    Birth control/protection: None  Other Topics Concern   Not on file  Social History Narrative   Not on file   Social Determinants of Health   Financial Resource Strain: Low Risk  (03/16/2021)   Overall Financial Resource Strain (CARDIA)    Difficulty of Paying Living Expenses: Not hard at all  Food Insecurity: No Food Insecurity (03/16/2021)   Hunger Vital Sign    Worried About Running Out of Food in the Last Year: Never true    [place] in the Last Year: Never true  Transportation Needs: No Transportation Needs (03/16/2021)   PRAPARE - Hydrologist (Medical): No    Lack of Transportation (Non-Medical): No  Physical Activity: Insufficiently Active (03/16/2021)   Exercise Vital Sign    Days of Exercise per Week: 3 days    Minutes of Exercise per Session: 30 min  Stress: No Stress Concern Present (03/16/2021)   [place]    Feeling of Stress : Not at all  Social Connections: Moderately Integrated (03/16/2021)   Social Connection and Isolation Panel [NHANES]    Frequency of Communication with Friends and Family: More than three times a week    Frequency of Social Gatherings with Friends and Family: More than three times a week    Attends Religious Services: More than 4 times per year    Active Member  of [employer] or Organizations: No    Attends Archivist Meetings: Never    Marital Status: Married  Human resources officer Violence: Not At Risk (03/16/2021)   Humiliation, Afraid, Rape, and Kick questionnaire    Fear of Current or Ex-Partner: No    Emotionally Abused: No    Physically Abused: No    Sexually Abused: No    Past Surgical Hx:  Past Surgical History:  Procedure Laterality Date   COLONOSCOPY  2007   Dr. Ardis Hughs   ROBOTIC ASSISTED TOTAL HYSTERECTOMY WITH BILATERAL SALPINGO OOPHERECTOMY Bilateral 06/21/2019   Procedure: XI ROBOTIC ASSISTED TOTAL HYSTERECTOMY WITH BILATERAL SALPINGO OOPHORECTOMY, EXCISION OF LEFT ABDOMINAL WALL MASS;  Surgeon: Everitt Amber, MD;  Location: [facility];  Service: Gynecology;  Laterality: Bilateral;   SENTINEL NODE BIOPSY N/A 06/21/2019   Procedure: SENTINEL NODE BIOPSY;  Surgeon: Everitt Amber, MD;  Location: [facility];  Service: Gynecology;  Laterality: N/A;    Past Medical Hx:  Past Medical History:  Diagnosis Date   Endometrial cancer ([place])    Hypothyroid    Personal history of colonic polyps    Postmenopausal bleeding  Uterine cancer [facility])     Past Gynecological History:  See HPI No LMP recorded. Patient is postmenopausal.  Family Hx:  Family History  Problem Relation Age of Onset   Cancer Mother 25       Breast cancer   Other Mother        polymyositis?   COPD Father    Cancer Brother        Throat cancer   Colon cancer Neg Hx    Endometrial cancer Neg Hx    Ovarian cancer Neg Hx    Review of Systems: Constitutional: Feels well. No fever, chills, early satiety, unintentional weight loss or gain. Cardiovascular: No chest pain, shortness of breath, or edema.  Pulmonary: No cough or wheeze.  Gastrointestinal: No nausea, vomiting, or diarrhea. No bright red blood per rectum or change in bowel movement.  Genitourinary: No frequency, urgency, or dysuria. No vaginal bleeding or discharge.  Musculoskeletal: No new myalgia or joint  pain. Neurologic: No new weakness, numbness, or change in gait.  Psychology: No new symptoms of depression, anxiety, or insomnia.  Health Maintenance: Mammogram: Last August 2022, having later today Pap Smear: N/A Cologuard: February 2022 Bone Density Scan: August 2022  Vitals:  Blood pressure 105/70, pulse 70, temperature 98.3 F (36.8 C), temperature source Oral, resp. rate 20, height 5' 5"  (1.651 m), weight 125 lb (56.7 kg), SpO2 100 %.  Physical Exam: General: Well developed, well nourished female in no acute distress. Alert and oriented x 3.  Neck: Supple without any enlargements.  Lymph node survey: No cervical, supraclavicular, or inguinal adenopathy.  Cardiovascular: Regular rate and rhythm. S1 and S2 normal.  Lungs: Clear to auscultation bilaterally. No wheezes/crackles/rhonchi noted.  Skin: No rashes or lesions present. Back: No CVA tenderness.  Abdomen: Abdomen soft, non-tender and non-obese. Active bowel sounds in all quadrants. No evidence of a fluid wave or abdominal masses. Incisions well healed without nodularity noted on palpation. Genitourinary:    Vulva/vagina: Normal external female genitalia. No lesions.    Urethra: No lesions or masses.    Vagina: Vaginal cuff well healed and smooth. No palpable masses. No vaginal bleeding or drainage noted. Cervix and uterus surgically absent. Rectal: Deferred, pt declined.  Extremities: No bilateral cyanosis, edema, or clubbing.    Dorothyann Gibbs, NP  01/11/2022, 4:35 PM

## 2022-01-11 NOTE — Patient Instructions (Signed)
No concerning findings on today's exam. Plan to follow up in six months or sooner if needed. Please call our office in the end of November, beginning of December 2023 to schedule an appointment for February 2024 at [phone].   Symptoms to report to your health care team include vaginal bleeding, rectal bleeding, bloating, weight loss without effort, new and persistent pain, new and  persistent fatigue, new leg swelling, new masses (i.e., bumps in your neck or groin), new and persistent cough, new and persistent nausea and vomiting, change in bowel or bladder habits, and any other concerns.

## 2022-01-14 ENCOUNTER — Other Ambulatory Visit: Payer: Self-pay | Admitting: Family Medicine

## 2022-01-14 DIAGNOSIS — E039 Hypothyroidism, unspecified: Secondary | ICD-10-CM

## 2022-03-22 ENCOUNTER — Ambulatory Visit: Payer: Medicare HMO

## 2022-03-23 ENCOUNTER — Ambulatory Visit (INDEPENDENT_AMBULATORY_CARE_PROVIDER_SITE_OTHER): Payer: Medicare HMO | Admitting: *Deleted

## 2022-03-23 DIAGNOSIS — Z0000 Encounter for general adult medical examination without abnormal findings: Secondary | ICD-10-CM | POA: Diagnosis not present

## 2022-03-23 NOTE — Patient Instructions (Signed)
Diane [name] , Thank you for taking time to come for your Medicare Wellness Visit. I appreciate your ongoing commitment to your health goals. Please review the following plan we discussed and let me know if I can assist you in the future.   These are the goals we discussed:  Goals      Patient Stated     Maintain current healthy lifestyle        This is a list of the screening recommended for you and due dates:  Health Maintenance  Topic Date Due   COVID-19 Vaccine (1) Never done   Zoster (Shingles) Vaccine (1 of 2) Never done   Medicare Annual Wellness Visit  03/24/2023   Cologuard (Stool DNA test)  07/15/2023   Mammogram  01/12/2024   Tetanus Vaccine  06/30/2031   Pneumonia Vaccine  Completed   DEXA scan (bone density measurement)  Completed   Hepatitis C Screening: USPSTF Recommendation to screen - Ages 59-79 yo.  Completed   HPV Vaccine  Aged Out   Flu Shot  Discontinued     Next appointment: Follow up in one year for your annual wellness visit    Preventive Care 65 Years and Older, Female Preventive care refers to lifestyle choices and visits with your health care provider that can promote health and wellness. What does preventive care include? A yearly physical exam. This is also called an annual well check. Dental exams once or twice a year. Routine eye exams. Ask your health care provider how often you should have your eyes checked. Personal lifestyle choices, including: Daily care of your teeth and gums. Regular physical activity. Eating a healthy diet. Avoiding tobacco and drug use. Limiting alcohol use. Practicing safe sex. Taking low-dose aspirin every day. Taking vitamin and mineral supplements as recommended by your health care provider. What happens during an annual well check? The services and screenings done by your health care provider during your annual well check will depend on your age, overall health, lifestyle risk factors, and family history of  disease. Counseling  Your health care provider may ask you questions about your: Alcohol use. Tobacco use. Drug use. Emotional well-being. Home and relationship well-being. Sexual activity. Eating habits. History of falls. Memory and ability to understand (cognition). Work and work Statistician. Reproductive health. Screening  You may have the following tests or measurements: Height, weight, and BMI. Blood pressure. Lipid and cholesterol levels. These may be checked every 5 years, or more frequently if you are over 36 years old. Skin check. Lung cancer screening. You may have this screening every year starting at age 50 if you have a 30-pack-year history of smoking and currently smoke or have quit within the past 15 years. Fecal occult blood test (FOBT) of the stool. You may have this test every year starting at age 45. Flexible sigmoidoscopy or colonoscopy. You may have a sigmoidoscopy every 5 years or a colonoscopy every 10 years starting at age 73. Hepatitis C blood test. Hepatitis B blood test. Sexually transmitted disease (STD) testing. Diabetes screening. This is done by checking your blood sugar (glucose) after you have not eaten for a while (fasting). You may have this done every 1-3 years. Bone density scan. This is done to screen for osteoporosis. You may have this done starting at age 27. Mammogram. This may be done every 1-2 years. Talk to your health care provider about how often you should have regular mammograms. Talk with your health care provider about your test results, treatment  options, and if necessary, the need for more tests. Vaccines  Your health care provider may recommend certain vaccines, such as: Influenza vaccine. This is recommended every year. Tetanus, diphtheria, and acellular pertussis (Tdap, Td) vaccine. You may need a Td booster every 10 years. Zoster vaccine. You may need this after age 52. Pneumococcal 13-valent conjugate (PCV13) vaccine. One  dose is recommended after age 2. Pneumococcal polysaccharide (PPSV23) vaccine. One dose is recommended after age 75. Talk to your health care provider about which screenings and vaccines you need and how often you need them. This information is not intended to replace advice given to you by your health care provider. Make sure you discuss any questions you have with your health care provider. Document Released: 05/30/2015 Document Revised: 01/21/2016 Document Reviewed: 03/04/2015 Elsevier Interactive Patient Education  2017 [place] Prevention in the Home Falls can cause injuries. They can happen to people of all ages. There are many things you can do to make your home safe and to help prevent falls. What can I do on the outside of my home? Regularly fix the edges of walkways and driveways and fix any cracks. Remove anything that might make you trip as you walk through a door, such as a raised step or threshold. Trim any bushes or trees on the path to your home. Use bright outdoor lighting. Clear any walking paths of anything that might make someone trip, such as rocks or tools. Regularly check to see if handrails are loose or broken. Make sure that both sides of any steps have handrails. Any raised decks and porches should have guardrails on the edges. Have any leaves, snow, or ice cleared regularly. Use sand or salt on walking paths during winter. Clean up any spills in your garage right away. This includes oil or grease spills. What can I do in the bathroom? Use night lights. Install grab bars by the toilet and in the tub and shower. Do not use towel bars as grab bars. Use non-skid mats or decals in the tub or shower. If you need to sit down in the shower, use a plastic, non-slip stool. Keep the floor dry. Clean up any water that spills on the floor as soon as it happens. Remove soap buildup in the tub or shower regularly. Attach bath mats securely with double-sided  non-slip rug tape. Do not have throw rugs and other things on the floor that can make you trip. What can I do in the bedroom? Use night lights. Make sure that you have a light by your bed that is easy to reach. Do not use any sheets or blankets that are too big for your bed. They should not hang down onto the floor. Have a firm chair that has side arms. You can use this for support while you get dressed. Do not have throw rugs and other things on the floor that can make you trip. What can I do in the kitchen? Clean up any spills right away. Avoid walking on wet floors. Keep items that you use a lot in easy-to-reach places. If you need to reach something above you, use a strong step stool that has a grab bar. Keep electrical cords out of the way. Do not use floor polish or wax that makes floors slippery. If you must use wax, use non-skid floor wax. Do not have throw rugs and other things on the floor that can make you trip. What can I do with my stairs? Do not  leave any items on the stairs. Make sure that there are handrails on both sides of the stairs and use them. Fix handrails that are broken or loose. Make sure that handrails are as long as the stairways. Check any carpeting to make sure that it is firmly attached to the stairs. Fix any carpet that is loose or worn. Avoid having throw rugs at the top or bottom of the stairs. If you do have throw rugs, attach them to the floor with carpet tape. Make sure that you have a light switch at the top of the stairs and the bottom of the stairs. If you do not have them, ask someone to add them for you. What else can I do to help prevent falls? Wear shoes that: Do not have high heels. Have rubber bottoms. Are comfortable and fit you well. Are closed at the toe. Do not wear sandals. If you use a stepladder: Make sure that it is fully opened. Do not climb a closed stepladder. Make sure that both sides of the stepladder are locked into place. Ask  someone to hold it for you, if possible. Clearly mark and make sure that you can see: Any grab bars or handrails. First and last steps. Where the edge of each step is. Use tools that help you move around (mobility aids) if they are needed. These include: Canes. Walkers. Scooters. Crutches. Turn on the lights when you go into a dark area. Replace any light bulbs as soon as they burn out. Set up your furniture so you have a clear path. Avoid moving your furniture around. If any of your floors are uneven, fix them. If there are any pets around you, be aware of where they are. Review your medicines with your doctor. Some medicines can make you feel dizzy. This can increase your chance of falling. Ask your doctor what other things that you can do to help prevent falls. This information is not intended to replace advice given to you by your health care provider. Make sure you discuss any questions you have with your health care provider. Document Released: 02/27/2009 Document Revised: 10/09/2015 Document Reviewed: 06/07/2014 Elsevier Interactive Patient Education  2017 [employer].

## 2022-03-23 NOTE — Progress Notes (Signed)
Subjective:   Diane [name] is a 71 y.o. female who presents for Medicare Annual (Subsequent) preventive examination.  I connected with  Woodroe Mode on 03/23/22 by a audio enabled telemedicine application and verified that I am speaking with the correct person using two identifiers.  Patient Location: Home  Provider Location: Office/Clinic  I discussed the limitations of evaluation and management by telemedicine. The patient expressed understanding and agreed to proceed.   Review of Systems    Defer to PCP Cardiac Risk Factors include: advanced age (>5mn, >>31women)     Objective:    There were no vitals filed for this visit. There is no height or weight on file to calculate BMI.     03/23/2022   11:01 AM 01/08/2022   11:41 AM 08/31/2021   11:43 AM 06/19/2021    1:58 PM 03/16/2021    7:43 AM 12/21/2019    2:21 PM 07/12/2019    2:43 PM  Advanced Directives  Does Patient Have a Medical Advance Directive? Yes No Yes Yes No No No  Type of [place] will  [place] will [place]'s AgencyLiving will     Does patient want to make changes to medical advance directive? No - Patient declined  No - Patient declined      Copy of [place] Chart? Yes - validated most recent copy scanned in chart (See row information)  Yes - validated most recent copy scanned in chart (See row information) No - copy requested     Would patient like information on creating a medical advance directive?  Yes (MAU/Ambulatory/Procedural Areas - Information given)   Yes (MAU/Ambulatory/Procedural Areas - Information given) Yes (MAU/Ambulatory/Procedural Areas - Information given) Yes (ED - Information included in AVS)    Current Medications (verified) Outpatient Encounter Medications as of 03/23/2022  Medication Sig   b complex vitamins capsule Take 1 capsule by mouth every evening.    calcium-vitamin D (OSCAL  WITH D) 500-200 MG-UNIT tablet Take 1 tablet by mouth every evening.    latanoprost (XALATAN) 0.005 % ophthalmic solution    levothyroxine (SYNTHROID) 125 MCG tablet Take 1 tablet (125 mcg total) by mouth daily before breakfast.   metroNIDAZOLE (METROCREAM) 0.75 % cream Apply 1 application topically every evening.   Multiple Vitamin (MULTIVITAMIN WITH MINERALS) TABS tablet Take 1 tablet by mouth every evening.   SULFACLEANSE 8/4 8-4 % SUSP 1 application apply on the skin DAILY  Wash face once a day   No facility-administered encounter medications on file as of 03/23/2022.    Allergies (verified) Patient has no known allergies.   History: Past Medical History:  Diagnosis Date   Endometrial cancer ([facility]    Hypothyroid    Personal history of colonic polyps    Postmenopausal bleeding    Uterine cancer ([facility]    Past Surgical History:  Procedure Laterality Date   COLONOSCOPY  2007   Dr. JArdis Hughs  ROBOTIC ASSISTED TOTAL HYSTERECTOMY WITH BILATERAL SALPINGO OOPHERECTOMY Bilateral 06/21/2019   Procedure: XI ROBOTIC ASSISTED TOTAL HYSTERECTOMY WITH BILATERAL SALPINGO OOPHORECTOMY, EXCISION OF LEFT ABDOMINAL WALL MASS;  Surgeon: REveritt Brenleigh Collet MD;  Location: [facility];  Service: Gynecology;  Laterality: Bilateral;   SENTINEL NODE BIOPSY N/A 06/21/2019   Procedure: SENTINEL NODE BIOPSY;  Surgeon: REveritt Khyli Swaim MD;  Location: [facility];  Service: Gynecology;  Laterality: N/A;   Family History  Problem Relation Age of Onset   Cancer Mother  69       Breast cancer   Other Mother        polymyositis?   COPD Father    Cancer Brother        Throat cancer   Colon cancer Neg Hx    Endometrial cancer Neg Hx    Ovarian cancer Neg Hx    Social History   Socioeconomic History   Marital status: Married    Spouse name: Not on file   Number of children: Not on file   Years of education: Not on file   Highest education level: Not on file  Occupational History   Not on file  Tobacco Use   Smoking status:  Never   Smokeless tobacco: Never  Vaping Use   Vaping Use: Never used  Substance and Sexual Activity   Alcohol use: No   Drug use: No   Sexual activity: Yes    Birth control/protection: None  Other Topics Concern   Not on file  Social History Narrative   Not on file   Social Determinants of Health   Financial Resource Strain: Low Risk  (03/16/2021)   Overall Financial Resource Strain (CARDIA)    Difficulty of Paying Living Expenses: Not hard at all  Food Insecurity: No Food Insecurity (03/23/2022)   Hunger Vital Sign    Worried About Running Out of Food in the Last Year: Never true    Ran Out of Food in the Last Year: Never true  Transportation Needs: No Transportation Needs (03/23/2022)   PRAPARE - Hydrologist (Medical): No    Lack of Transportation (Non-Medical): No  Physical Activity: Insufficiently Active (03/16/2021)   Exercise Vital Sign    Days of Exercise per Week: 3 days    Minutes of Exercise per Session: 30 min  Stress: No Stress Concern Present (03/16/2021)   [place]    Feeling of Stress : Not at all  Social Connections: Moderately Integrated (03/16/2021)   Social Connection and Isolation Panel [NHANES]    Frequency of Communication with Friends and Family: More than three times a week    Frequency of Social Gatherings with Friends and Family: More than three times a week    Attends Religious Services: More than 4 times per year    Active Member of [employer] or Organizations: No    Attends Archivist Meetings: Never    Marital Status: Married    Tobacco Counseling Counseling given: Not Answered   Clinical Intake:  Pre-visit preparation completed: Yes  Pain : No/denies pain  Diabetes: No  How often do you need to have someone help you when you read instructions, pamphlets, or other written materials from your doctor or pharmacy?: 1 -  Never   Activities of Daily Living    03/23/2022   11:03 AM  In your present state of health, do you have any difficulty performing the following activities:  Hearing? 0  Vision? 0  Difficulty concentrating or making decisions? 0  Walking or climbing stairs? 0  Dressing or bathing? 0  Doing errands, shopping? 0  Preparing Food and eating ? N  Using the Toilet? N  In the past six months, have you accidently leaked urine? N  Do you have problems with loss of bowel control? N  Managing your Medications? N  Managing your Finances? N  Housekeeping or managing your Housekeeping? N    Patient Care Team: Copland, Gay Filler,  MD as PCP - General (Family Medicine)  Indicate any recent Medical Services you may have received from other than [facility] providers in the past year (date may be approximate).     Assessment:   This is a routine wellness examination for [place].  Hearing/Vision screen No results found.  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 0 0 0 0 No  Number falls in past yr: 0 0 0    Injury with Fall? 0 0 0    Risk for fall due to : No Fall Risks      Follow up Falls evaluation completed  Falls prevention discussed      [place]:  Any stairs in or around the home? Yes  If so, are there any without handrails? No  Home free of loose throw rugs in walkways, pet beds, electrical cords,  etc? Yes  Adequate lighting in your home to reduce risk of falls? Yes   ASSISTIVE DEVICES UTILIZED TO PREVENT FALLS:  Life alert? No  Use of a cane, walker or w/c? No  Grab bars in the bathroom? No  Shower chair or bench in shower? No  Elevated toilet seat or a handicapped toilet? No   TIMED UP AND GO:  Was the test performed?  No, audio visit .   Cognitive Function:        03/23/2022   11:07 AM  6CIT Screen  What Year? 0 points  What month? 0 points  What time? 0 points  Count back from 20 0 points  Months in reverse 0 points  Repeat phrase 0 points  Total Score 0 points    Immunizations Immunization History  Administered Date(s) Administered   DT (Pediatric) 11/09/2002   Pneumococcal Conjugate-13 06/15/2017   Pneumococcal Polysaccharide-23 10/30/2018   Td 06/29/2021   Tdap 06/25/2011    TDAP status: Up to date  Flu Vaccine status: Up to date  Pneumococcal vaccine status: Up to date  Covid-19 vaccine status: Information provided on how to obtain vaccines.   Qualifies for Shingles Vaccine? Yes   Zostavax completed No   Shingrix Completed?: No.    Education has been provided regarding the importance of this vaccine. Patient has been advised to call insurance company to determine out of pocket expense if they have not yet received this vaccine. Advised may also receive vaccine at local pharmacy or Health Dept. Verbalized acceptance and understanding.  Screening Tests Health Maintenance  Topic Date Due   COVID-19 Vaccine (1) Never done   Zoster Vaccines- Shingrix (1 of 2) Never done   Medicare Annual Wellness (AWV)  03/16/2022   Fecal DNA (Cologuard)  07/15/2023   MAMMOGRAM  01/12/2024   TETANUS/TDAP  06/30/2031  Pneumonia Vaccine 66+ Years old  Completed   DEXA SCAN  Completed   Hepatitis C Screening  Completed   HPV VACCINES  Aged Out   INFLUENZA VACCINE  Discontinued    Health Maintenance  Health Maintenance Due  Topic Date Due   COVID-19  Vaccine (1) Never done   Zoster Vaccines- Shingrix (1 of 2) Never done   Medicare Annual Wellness (AWV)  03/16/2022    Colorectal cancer screening: Type of screening: Cologuard. Completed 07/14/20. Repeat every 3 years  Mammogram status: Completed 01/11/22. Repeat every year  Bone Density status: Completed 01/01/21. Results reflect: Bone density results: OSTEOPENIA. Repeat every 2 years.  Lung Cancer Screening: (Low Dose CT Chest recommended if Age 52-80 years, 30 pack-year currently smoking OR have quit w/in 15years.) does not qualify.   Lung Cancer Screening Referral: N/a  Additional Screening:  Hepatitis C Screening: does qualify; Completed 02/17/15  Vision Screening: Recommended annual ophthalmology exams for early detection of glaucoma and other disorders of the eye. Is the patient up to date with their annual eye exam?  Yes  Who is the provider or what is the name of the office in which the patient attends annual eye exams? Dr. Blair Hailey If pt is not established with a provider, would they like to be referred to a provider to establish care? No .   Dental Screening: Recommended annual dental exams for proper oral hygiene  Community Resource Referral / Chronic Care Management: CRR required this visit?  No   CCM required this visit?  No      Plan:     I have personally reviewed and noted the following in the patient's chart:   Medical and social history Use of alcohol, tobacco or illicit drugs  Current medications and supplements including opioid prescriptions. Patient is not currently taking opioid prescriptions. Functional ability and status Nutritional status Physical activity Advanced directives List of other physicians Hospitalizations, surgeries, and ER visits in previous 12 months Vitals Screenings to include cognitive, depression, and falls Referrals and appointments  In addition, I have reviewed and discussed with patient certain preventive protocols, quality  metrics, and best practice recommendations. A written personalized care plan for preventive services as well as general preventive health recommendations were provided to patient.   Due to this being a telephonic visit, the after visit summary with patients personalized plan was offered to patient via mail or my-chart.  Patient would like to access on my-chart.  Beatris Ship, [place]   03/23/2022   Nurse Notes: None

## 2022-06-09 IMAGING — MG MM DIGITAL SCREENING BILAT W/ TOMO AND CAD
8 series · 9 of 24 positions shown · non-contrast
Comparison: Previous exam(s).

CLINICAL DATA: Screening.

EXAM:
DIGITAL SCREENING BILATERAL MAMMOGRAM WITH TOMOSYNTHESIS AND CAD
TECHNIQUE: Bilateral screening digital craniocaudal and mediolateral oblique
mammograms were obtained. Bilateral screening digital breast
tomosynthesis was performed. The images were evaluated with
computer-aided detection.

[R CC synth-2D]
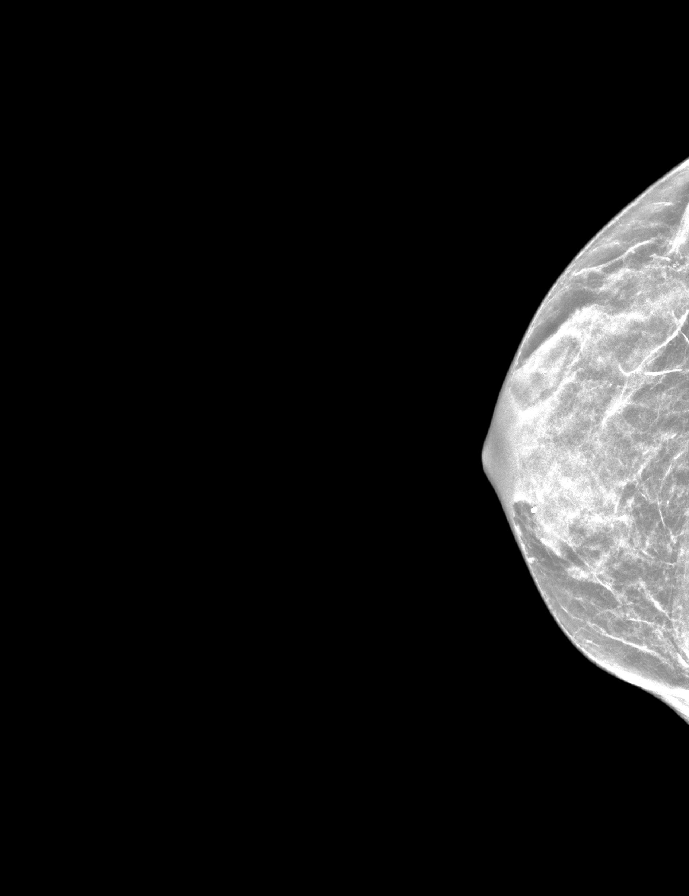

[L CC synth-2D]
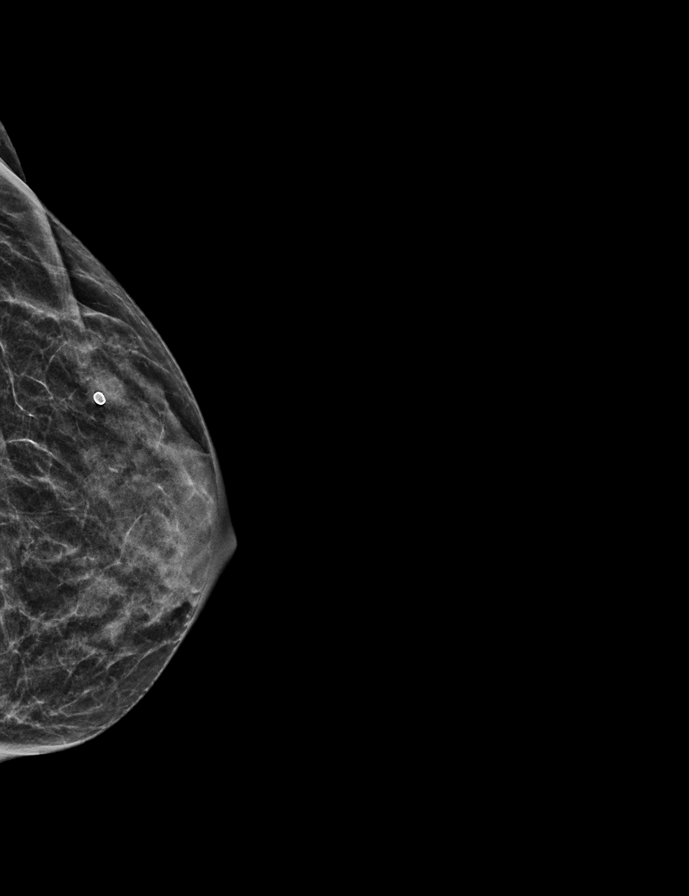

[R MLO synth-2D]
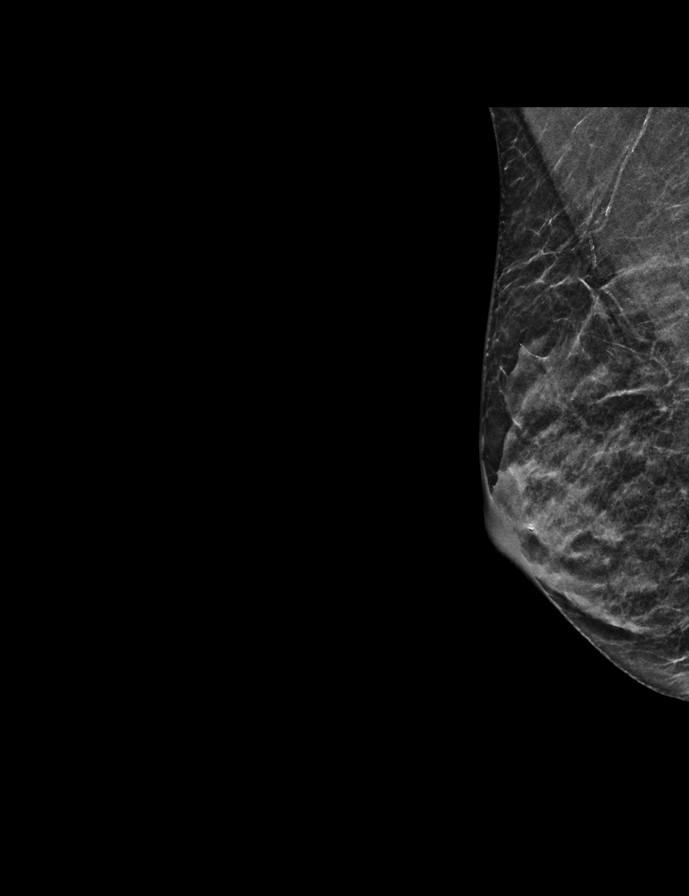

[L MLO synth-2D]
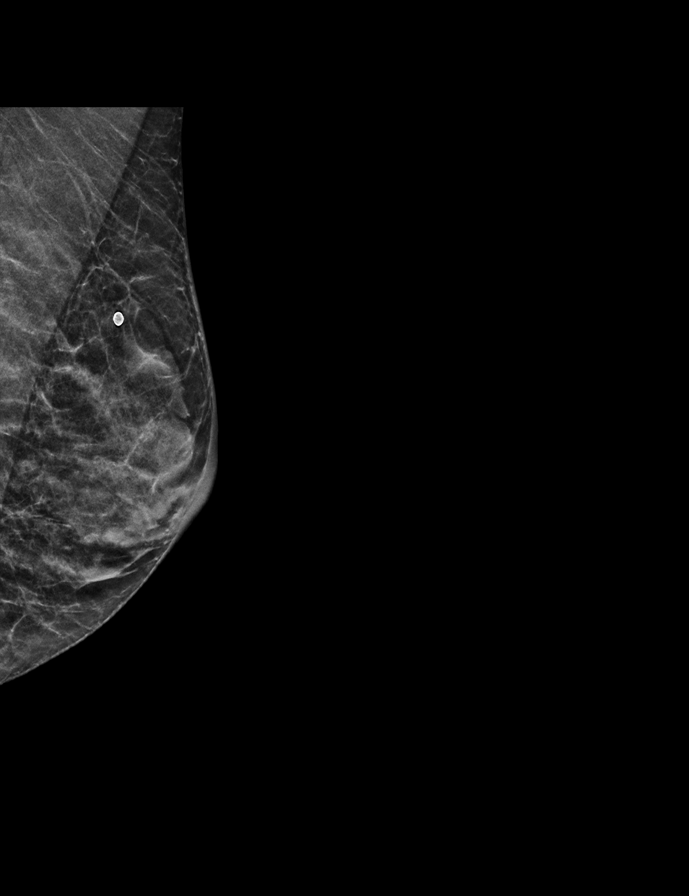

[L CC tomo · 2 of 35 frames shown]
[frame 12/35]
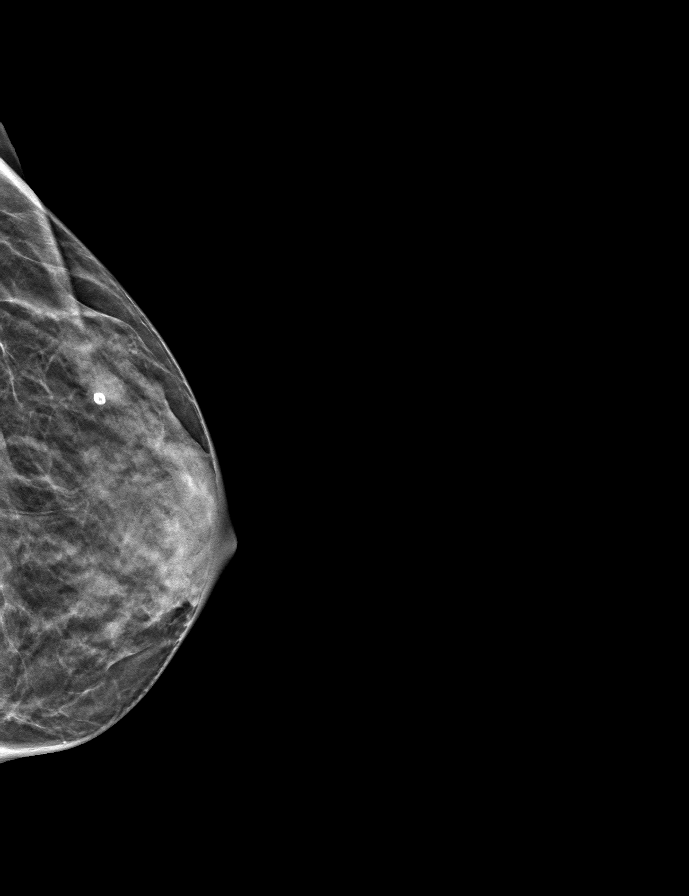
[frame 18/35]
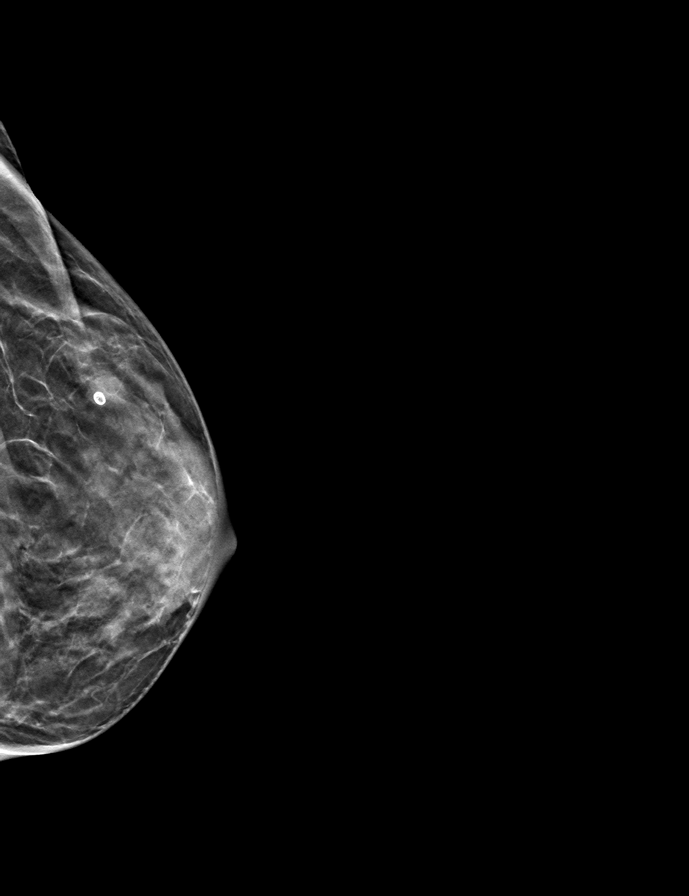

[L MLO tomo · tomo slice 19/36.0]
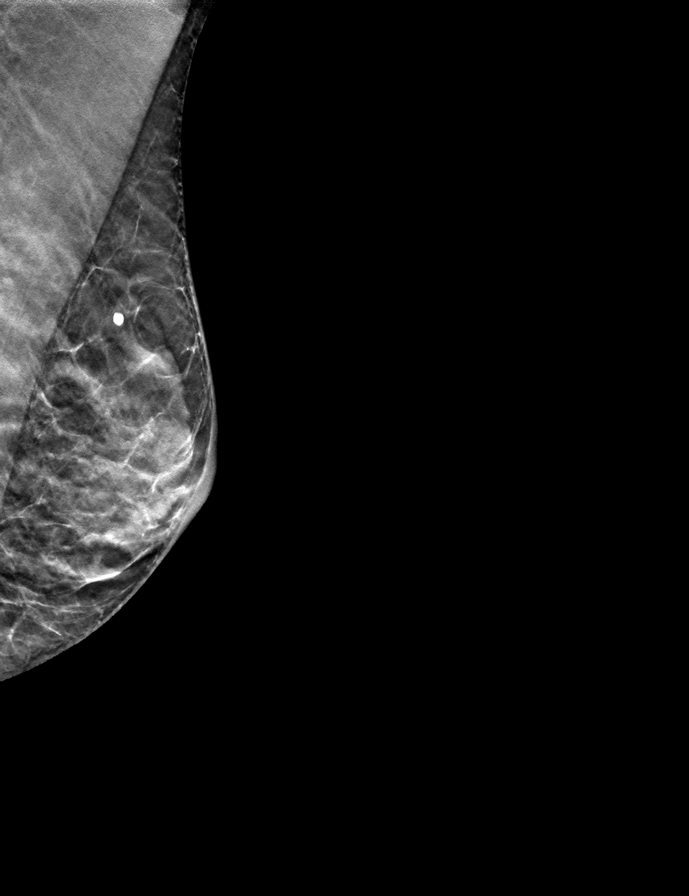

[R MLO tomo · tomo slice 17/34.0]
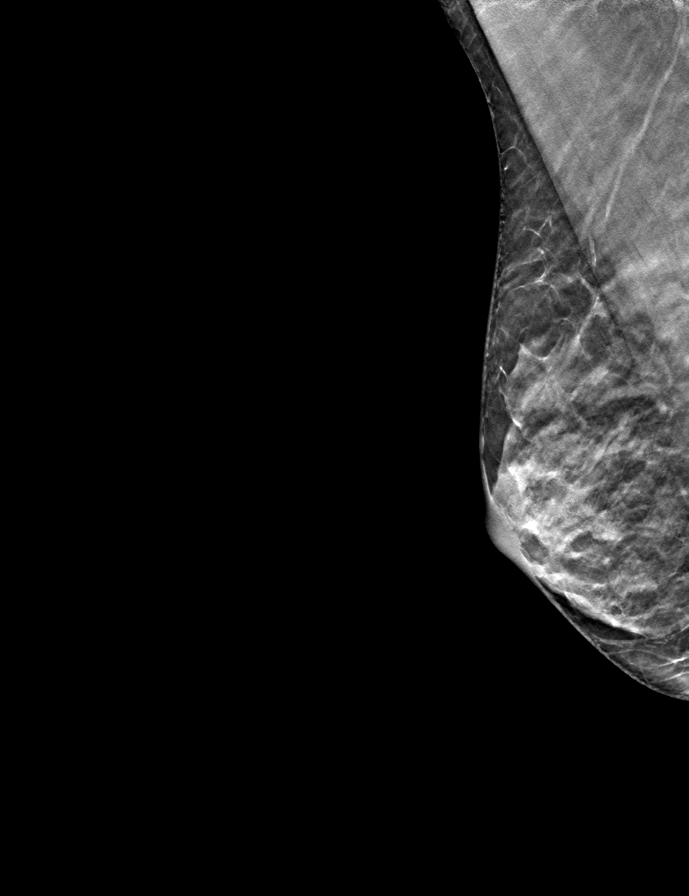

[R CC tomo · tomo slice 21/40.0]
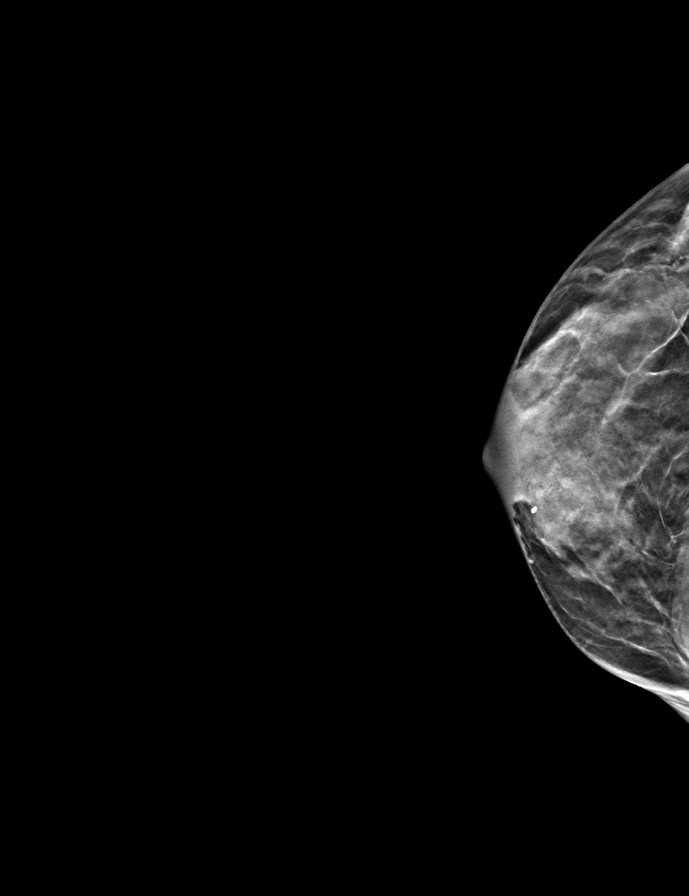

[9 of 24 positions shown; findings below may reference images not displayed]

ACR Breast Density Category c: The breast tissue is heterogeneously
dense, which may obscure small masses.
FINDINGS: There are no findings suspicious for malignancy.
IMPRESSION: No mammographic evidence of malignancy. A result letter of this
screening mammogram will be mailed directly to the patient.

RECOMMENDATION:
Screening mammogram in one year. (Code:Q3-W-BC3)

BI-RADS CATEGORY  1: Negative.

## 2022-07-01 ENCOUNTER — Encounter: Payer: Self-pay | Admitting: Gynecologic Oncology

## 2022-07-02 NOTE — Patient Instructions (Incomplete)
It was good to see again today, I will be in touch with your labs I ordered a bone density for you to have done in about 6 months  Keep up the good work with physical activity

## 2022-07-02 NOTE — Progress Notes (Addendum)
[place] [facility] [address] [place], [place] [address] [phone]  Date:  07/05/2022   Name:  Diane [name]   DOB:  1950-08-02   MRN:  [id number]  PCP:  Darreld Mclean, MD    Chief Complaint: No chief complaint on file.   History of Present Illness:  Diane [name] is a 72 y.o. very pleasant female patient who presents with the following:  Patient seen today for physical exam Most recent visit with myself was in August History of osteopenia, hypothyroidism, endometrial carcinoma-February 2022 she underwent total hysterectomy and resection of abdominal wall mass  She is on a twice a year follow-up plan with gynecologic oncology right now  She has declined flu, COVID, shingles vaccination  Cologuard, mammogram up-to-date DEXA scan 8/22, osteopenia Most recent labs on chart 1 year ago-update today Lab Results  Component Value Date   TSH 1.31 06/29/2021   Levothyroxine 125  Patient Active Problem List   Diagnosis Date Noted   Abdominal wall mass 06/21/2019   Endometrial carcinoma ([place]) 06/14/2019   Osteopenia 12/01/2018   Hypothyroid 12/29/2012    Past Medical History:  Diagnosis Date   Endometrial cancer ([place])    Hypothyroid    Personal history of colonic polyps    Postmenopausal bleeding    Uterine cancer ([place])     Past Surgical History:  Procedure Laterality Date   COLONOSCOPY  2007   Dr. Ardis Hughs   ROBOTIC ASSISTED TOTAL HYSTERECTOMY WITH BILATERAL SALPINGO OOPHERECTOMY Bilateral 06/21/2019   Procedure: XI ROBOTIC ASSISTED TOTAL HYSTERECTOMY WITH BILATERAL SALPINGO OOPHORECTOMY, EXCISION OF LEFT ABDOMINAL WALL MASS;  Surgeon: Everitt Amber, MD;  Location: [facility];  Service: Gynecology;  Laterality: Bilateral;   SENTINEL NODE BIOPSY N/A 06/21/2019   Procedure: SENTINEL NODE BIOPSY;  Surgeon: Everitt Amber, MD;  Location: [facility];  Service: Gynecology;  Laterality: N/A;    Social History   Tobacco Use    Smoking status: Never   Smokeless tobacco: Never  Vaping Use   Vaping Use: Never used  Substance Use Topics   Alcohol use: No   Drug use: No    Family History  Problem Relation Age of Onset   Cancer Mother 38       Breast cancer   Other Mother        polymyositis?   COPD Father    Cancer Brother        Throat cancer   Colon cancer Neg Hx    Endometrial cancer Neg Hx    Ovarian cancer Neg Hx     No Known Allergies  Medication list has been reviewed and updated.  Current Outpatient Medications on File Prior to Visit  Medication Sig Dispense Refill   b complex vitamins capsule Take 1 capsule by mouth every evening.      calcium-vitamin D (OSCAL WITH D) 500-200 MG-UNIT tablet Take 1 tablet by mouth every evening.      latanoprost (XALATAN) 0.005 % ophthalmic solution      levothyroxine (SYNTHROID) 125 MCG tablet Take 1 tablet (125 mcg total) by mouth daily before breakfast. 90 tablet 1   metroNIDAZOLE (METROCREAM) 0.75 % cream Apply 1 application topically every evening.     Multiple Vitamin (MULTIVITAMIN WITH MINERALS) TABS tablet Take 1 tablet by mouth every evening.     SULFACLEANSE 8/4 8-4 % SUSP 1 application apply on the skin DAILY  Wash face once a day  No current facility-administered medications on file prior to visit.    Review of Systems:  As per HPI- otherwise negative.   Physical Examination: There were no vitals filed for this visit. There were no vitals filed for this visit. There is no height or weight on file to calculate BMI. Ideal Body Weight:    GEN: no acute distress. HEENT: Atraumatic, Normocephalic.  Ears and Nose: No external deformity. CV: RRR, No M/G/R. No JVD. No thrill. No extra heart sounds. PULM: CTA B, no wheezes, crackles, rhonchi. No retractions. No resp. distress. No accessory muscle use. ABD: S, NT, ND, +BS. No rebound. No HSM. EXTR: No c/c/e PSYCH: Normally interactive. Conversant.    Assessment and Plan: *** Physical  exam today.  Encouraged healthy diet and exercise routine Signed Lamar Blinks, MD

## 2022-07-05 ENCOUNTER — Encounter: Payer: Self-pay | Admitting: Family Medicine

## 2022-07-05 ENCOUNTER — Ambulatory Visit (INDEPENDENT_AMBULATORY_CARE_PROVIDER_SITE_OTHER): Payer: Medicare HMO | Admitting: Family Medicine

## 2022-07-05 ENCOUNTER — Inpatient Hospital Stay: Payer: Medicare HMO | Attending: Gynecologic Oncology | Admitting: Gynecologic Oncology

## 2022-07-05 VITALS — BP 130/71 | HR 58 | Temp 97.7°F | Resp 14 | Ht 68.0 in | Wt 128.0 lb

## 2022-07-05 VITALS — BP 118/74 | HR 65 | Ht 68.0 in | Wt 125.2 lb

## 2022-07-05 DIAGNOSIS — E039 Hypothyroidism, unspecified: Secondary | ICD-10-CM

## 2022-07-05 DIAGNOSIS — M8580 Other specified disorders of bone density and structure, unspecified site: Secondary | ICD-10-CM

## 2022-07-05 DIAGNOSIS — Z13220 Encounter for screening for lipoid disorders: Secondary | ICD-10-CM | POA: Diagnosis not present

## 2022-07-05 DIAGNOSIS — Z90722 Acquired absence of ovaries, bilateral: Secondary | ICD-10-CM | POA: Diagnosis not present

## 2022-07-05 DIAGNOSIS — Z9079 Acquired absence of other genital organ(s): Secondary | ICD-10-CM | POA: Diagnosis not present

## 2022-07-05 DIAGNOSIS — R5383 Other fatigue: Secondary | ICD-10-CM

## 2022-07-05 DIAGNOSIS — M859 Disorder of bone density and structure, unspecified: Secondary | ICD-10-CM | POA: Diagnosis not present

## 2022-07-05 DIAGNOSIS — Z90710 Acquired absence of both cervix and uterus: Secondary | ICD-10-CM | POA: Insufficient documentation

## 2022-07-05 DIAGNOSIS — C541 Malignant neoplasm of endometrium: Secondary | ICD-10-CM

## 2022-07-05 DIAGNOSIS — Z130 Encounter for screening for diseases of the blood and blood-forming organs and certain disorders involving the immune mechanism: Secondary | ICD-10-CM

## 2022-07-05 DIAGNOSIS — Z131 Encounter for screening for diabetes mellitus: Secondary | ICD-10-CM

## 2022-07-05 DIAGNOSIS — Z8542 Personal history of malignant neoplasm of other parts of uterus: Secondary | ICD-10-CM | POA: Insufficient documentation

## 2022-07-05 DIAGNOSIS — Z0000 Encounter for general adult medical examination without abnormal findings: Secondary | ICD-10-CM

## 2022-07-05 NOTE — Patient Instructions (Signed)
You are doing well!   No concerning findings on today's exam. Plan to follow up in six months or sooner if needed. Please call our office in the end of June, beginning of July 2024 to schedule an appointment for August 2024 at [phone].   Symptoms to report to your health care team include vaginal bleeding, rectal bleeding, bloating, weight loss without effort, new and persistent pain, new and  persistent fatigue, new leg swelling, new masses (i.e., bumps in your neck or groin), new and persistent cough, new and persistent nausea and vomiting, change in bowel or bladder habits, and any other concerns.    Just for your records, your surgery was on 06/21/2019. We will plan for continued surveillance for 5 years from that time.

## 2022-07-06 ENCOUNTER — Encounter: Payer: Self-pay | Admitting: Family Medicine

## 2022-07-06 DIAGNOSIS — E039 Hypothyroidism, unspecified: Secondary | ICD-10-CM

## 2022-07-06 LAB — CBC
HCT: 40 % (ref 36.0–46.0)
Hemoglobin: 13.4 g/dL (ref 12.0–15.0)
MCHC: 33.7 g/dL (ref 30.0–36.0)
MCV: 100.1 fl — ABNORMAL HIGH (ref 78.0–100.0)
Platelets: 205 10*3/uL (ref 150.0–400.0)
RBC: 3.99 Mil/uL (ref 3.87–5.11)
RDW: 14.4 % (ref 11.5–15.5)
WBC: 5.5 10*3/uL (ref 4.0–10.5)

## 2022-07-06 LAB — COMPREHENSIVE METABOLIC PANEL
ALT: 27 U/L (ref 0–35)
AST: 27 U/L (ref 0–37)
Albumin: 4.2 g/dL (ref 3.5–5.2)
Alkaline Phosphatase: 54 U/L (ref 39–117)
BUN: 16 mg/dL (ref 6–23)
CO2: 29 mEq/L (ref 19–32)
Calcium: 9.6 mg/dL (ref 8.4–10.5)
Chloride: 103 mEq/L (ref 96–112)
Creatinine, Ser: 0.93 mg/dL (ref 0.40–1.20)
GFR: 62.41 mL/min (ref >60.00)
Glucose, Bld: 80 mg/dL (ref 70–99)
Potassium: 4 mEq/L (ref 3.5–5.1)
Sodium: 141 mEq/L (ref 135–145)
Total Bilirubin: 0.5 mg/dL (ref 0.2–1.2)
Total Protein: 6.6 g/dL (ref 6.0–8.3)

## 2022-07-06 LAB — LIPID PANEL
Cholesterol: 216 mg/dL — ABNORMAL HIGH (ref 0–200)
HDL: 88.9 mg/dL (ref >39.00)
LDL Cholesterol: 104 mg/dL — ABNORMAL HIGH (ref 0–99)
NonHDL: 127.13
Total CHOL/HDL Ratio: 2
Triglycerides: 114 mg/dL (ref 0.0–149.0)
VLDL: 22.8 mg/dL (ref 0.0–40.0)

## 2022-07-06 LAB — HEMOGLOBIN A1C: Hgb A1c MFr Bld: 5.6 % (ref 4.6–6.5)

## 2022-07-06 LAB — FERRITIN: Ferritin: 44.7 ng/mL (ref 10.0–291.0)

## 2022-07-06 LAB — TSH: TSH: 5.16 u[IU]/mL (ref 0.35–5.50)

## 2022-07-06 LAB — VITAMIN D 25 HYDROXY (VIT D DEFICIENCY, FRACTURES): VITD: 32.33 ng/mL (ref 30.00–100.00)

## 2022-07-06 MED ORDER — LEVOTHYROXINE SODIUM 137 MCG PO TABS: 137.0000 ug | ORAL_TABLET | Freq: Every day | ORAL | 2 refills | Status: DC

## 2022-07-06 NOTE — Progress Notes (Signed)
Gynecologic Oncology Follow-up Visit  Chief Complaint:  Chief Complaint  Patient presents with   Endometrial carcinoma [facility])    Assessment/Plan: Diane [name]  is a 72 y.o. female with stage IA grade 3 endometrioid cancer (MSI stable/MMR proficient). She is s/p robotic assisted laparoscopic staging on 06/21/19 with Dr. Everitt Amber. Pathology revealed low risk factors for recurrence, therefore no adjuvant therapy is recommended according to NCCN guidelines.  No findings on today's exam concerning for recurrence. She is advised to follow up in August 2024 or sooner if needed. Symptoms to report listed in instructions of AVS. She is advised to call for any needs, concerns, or new symptoms.  We continue to recommend she have follow-up every 6 months for 5 years in accordance with NCCN guidelines. Those visits should include symptom assessment, physical exam and pelvic examination. Pap smears are not indicated or recommended in the routine surveillance of endometrial cancer.  HPI: Diane [name] is a 72 year old P3 who was initially seen in consultation at the request of Dr. Ihor Dow for evaluation of grade 3 endometrial cancer. She developed postmenopausal bleeding in late November 2020 and was seen by her primary care physician. It was determined to be uterine bleeding and she referred her to her gynecologist. She underwent a transvaginal ultrasound scan on June 06, 2019 which revealed a uterus measuring 6.1 x 3.7 x 5.1 cm with a 22 mm endometrial thickness. Right and left ovaries were normal.  She then underwent a transvaginal endometrial pipelle biopsy on June 14, 2019, which revealed high-grade endometrial adenocarcinoma with a differential diagnosis including endometrioid or serous carcinoma. Pap test was normal with negative high-risk HPV performed on October 30, 2018.  The patient underwent a CT scan of the abdomen and pelvis with IV contrast on June 14, 2019 which revealed a  heterogeneous 2.4 cm mass in the fundus of the uterus but no evidence of metastatic disease.  There was some aortic atherosclerosis but no additional findings that were significant.  On 06/26/19, she underwent robotic assisted total hysterectomy, BSO, SLN biopsy with Dr. Everitt Amber. Intraoperative findings were significant for a small normal sized uterus and a left abdominal sebaceous cyst. Surgery was uncomplicated.   Final pathology revealed a FIGO grade 3 stage IA endometrioid endometrial cancer. There was inner half myo invasion (2 of 26m). There was no LVSI, no cervical/adnexal/nodal inolvement. The tumor was MSI stable/MMR proficient. She met low risk factors for recurrence and therefore no adjuvant therapy was recommended in accordance with NCCN guidelines.  Interval History: She presents today for continued follow up. She has been doing well since her last visit. She continues to stay busy and helps to take care of her grandchildren. There have been no changes in her health. She continues to stay very active with her occupation and family. No symptoms of recurrence voiced including vaginal bleeding, change in bowel or bladder habits, new abdominal/pelvic pain. Tolerating diet with no nausea or emesis. No pain reported. She has plans to see her PCP later today for a check up. She plans to discuss possible adjustment to thyroid medication due to feeling slightly sluggish. No concerns voiced.   Current Meds:  Outpatient Encounter Medications as of 07/05/2022  Medication Sig   b complex vitamins capsule Take 1 capsule by mouth every evening.    calcium-vitamin D (OSCAL WITH D) 500-200 MG-UNIT tablet Take 1 tablet by mouth every evening.    latanoprost (XALATAN) 0.005 % ophthalmic solution    levothyroxine (SYNTHROID) 125 MCG tablet  Take 1 tablet (125 mcg total) by mouth daily before breakfast.   metroNIDAZOLE (METROCREAM) 0.75 % cream Apply 1 application topically every evening.   Multiple Vitamin  (MULTIVITAMIN WITH MINERALS) TABS tablet Take 1 tablet by mouth every evening.   SULFACLEANSE 8/4 8-4 % SUSP 1 application apply on the skin DAILY  Wash face once a day   No facility-administered encounter medications on file as of 07/05/2022.    Allergy: No Known Allergies  Social Hx:   Social History   Socioeconomic History   Marital status: Married    Spouse name: Not on file   Number of children: Not on file   Years of education: Not on file   Highest education level: Not on file  Occupational History   Not on file  Tobacco Use   Smoking status: Never   Smokeless tobacco: Never  Vaping Use   Vaping Use: Never used  Substance and Sexual Activity   Alcohol use: No   Drug use: No   Sexual activity: Yes    Birth control/protection: None  Other Topics Concern   Not on file  Social History Narrative   Not on file   Social Determinants of Health   Financial Resource Strain: Low Risk  (03/16/2021)   Overall Financial Resource Strain (CARDIA)    Difficulty of Paying Living Expenses: Not hard at all  Food Insecurity: No Food Insecurity (03/23/2022)   Hunger Vital Sign    Worried About Running Out of Food in the Last Year: Never true    [place] in the Last Year: Never true  Transportation Needs: No Transportation Needs (03/23/2022)   PRAPARE - Hydrologist (Medical): No    Lack of Transportation (Non-Medical): No  Physical Activity: Insufficiently Active (03/16/2021)   Exercise Vital Sign    Days of Exercise per Week: 3 days    Minutes of Exercise per Session: 30 min  Stress: No Stress Concern Present (03/16/2021)   [place]    Feeling of Stress : Not at all  Social Connections: Moderately Integrated (03/16/2021)   Social Connection and Isolation Panel [NHANES]    Frequency of Communication with Friends and Family: More than three times a week    Frequency of Social  Gatherings with Friends and Family: More than three times a week    Attends Religious Services: More than 4 times per year    Active Member of [employer] or Organizations: No    Attends Archivist Meetings: Never    Marital Status: Married  Human resources officer Violence: Not At Risk (03/23/2022)   Humiliation, Afraid, Rape, and Kick questionnaire    Fear of Current or Ex-Partner: No    Emotionally Abused: No    Physically Abused: No    Sexually Abused: No    Past Surgical Hx:  Past Surgical History:  Procedure Laterality Date   COLONOSCOPY  2007   Dr. Ardis Hughs   ROBOTIC ASSISTED TOTAL HYSTERECTOMY WITH BILATERAL SALPINGO OOPHERECTOMY Bilateral 06/21/2019   Procedure: XI ROBOTIC ASSISTED TOTAL HYSTERECTOMY WITH BILATERAL SALPINGO OOPHORECTOMY, EXCISION OF LEFT ABDOMINAL WALL MASS;  Surgeon: Everitt Amber, MD;  Location: [facility];  Service: Gynecology;  Laterality: Bilateral;   SENTINEL NODE BIOPSY N/A 06/21/2019   Procedure: SENTINEL NODE BIOPSY;  Surgeon: Everitt Amber, MD;  Location: [facility];  Service: Gynecology;  Laterality: N/A;    Past Medical Hx:  Past Medical History:  Diagnosis Date  Endometrial cancer ([place])    Hypothyroid    Personal history of colonic polyps    Postmenopausal bleeding    Uterine cancer [facility])     Past Gynecological History:  See HPI No LMP recorded. Patient is postmenopausal.  Family Hx:  Family History  Problem Relation Age of Onset   Cancer Mother 16       Breast cancer   Other Mother        polymyositis?   COPD Father    Cancer Brother        Throat cancer   Colon cancer Neg Hx    Endometrial cancer Neg Hx    Ovarian cancer Neg Hx    Review of Systems: Constitutional: Feels well. No fever, chills, early satiety, unintentional weight loss or gain. Cardiovascular: No chest pain, shortness of breath, or edema.  Pulmonary: No cough or wheeze.  Gastrointestinal: No nausea, vomiting, or diarrhea. No bright red blood per rectum or change in bowel  movement.  Genitourinary: No frequency, urgency, or dysuria. No vaginal bleeding or discharge.  Musculoskeletal: No new myalgia or joint pain. Neurologic: No new weakness, numbness, or change in gait.  Psychology: No new symptoms of depression, anxiety, or insomnia.  Health Maintenance: Mammogram: Last August 2023 Pap Smear: N/A Cologuard: February 2022 Bone Density Scan: August 2022  Vitals:  Blood pressure 130/71, pulse (!) 58, temperature 97.7 F (36.5 C), temperature source Oral, resp. rate 14, height 5' 8"$  (1.727 m), weight 128 lb (58.1 kg), SpO2 100 %.  Physical Exam: General: Well developed, well nourished female in no acute distress. Alert and oriented x 3.  Neck: Supple without any enlargements.  Lymph node survey: No cervical, supraclavicular, or inguinal adenopathy.  Cardiovascular: Regular rate and rhythm. S1 and S2 normal.  Lungs: Clear to auscultation bilaterally. No wheezes/crackles/rhonchi noted.  Skin: No rashes or lesions present. Back: No CVA tenderness.  Abdomen: Abdomen soft, non-tender and non-obese. Active bowel sounds in all quadrants. No evidence of a fluid wave or abdominal masses. Incisions well healed without nodularity noted on palpation. Genitourinary:    Vulva/vagina: Normal external female genitalia. No lesions.    Urethra: No lesions or masses.    Vagina: Vaginal cuff well healed and smooth. No palpable masses. No vaginal bleeding or drainage noted. Cervix and uterus surgically absent. Rectal: Good tone, no masses or nodularity palpated.  Extremities: No bilateral cyanosis, edema, or clubbing.    Dorothyann Gibbs, NP  07/06/2022, 10:37 AM

## 2022-07-26 DIAGNOSIS — Z0100 Encounter for examination of eyes and vision without abnormal findings: Secondary | ICD-10-CM | POA: Diagnosis not present

## 2022-08-17 ENCOUNTER — Encounter: Payer: Self-pay | Admitting: Family Medicine

## 2022-08-17 ENCOUNTER — Other Ambulatory Visit (INDEPENDENT_AMBULATORY_CARE_PROVIDER_SITE_OTHER): Payer: Medicare HMO

## 2022-08-17 DIAGNOSIS — E039 Hypothyroidism, unspecified: Secondary | ICD-10-CM

## 2022-08-17 LAB — TSH: TSH: 0.66 u[IU]/mL (ref 0.35–5.50)

## 2022-12-20 ENCOUNTER — Encounter: Payer: Self-pay | Admitting: Gynecologic Oncology

## 2022-12-21 ENCOUNTER — Inpatient Hospital Stay: Payer: Medicare HMO | Attending: Gynecologic Oncology | Admitting: Gynecologic Oncology

## 2022-12-21 ENCOUNTER — Other Ambulatory Visit: Payer: Self-pay

## 2022-12-21 VITALS — BP 119/85 | HR 62 | Temp 98.0°F | Resp 16 | Wt 124.8 lb

## 2022-12-21 DIAGNOSIS — C541 Malignant neoplasm of endometrium: Secondary | ICD-10-CM | POA: Diagnosis not present

## 2022-12-21 DIAGNOSIS — Z8542 Personal history of malignant neoplasm of other parts of uterus: Secondary | ICD-10-CM | POA: Insufficient documentation

## 2022-12-21 DIAGNOSIS — Z90722 Acquired absence of ovaries, bilateral: Secondary | ICD-10-CM | POA: Diagnosis not present

## 2022-12-21 DIAGNOSIS — Z90710 Acquired absence of both cervix and uterus: Secondary | ICD-10-CM | POA: Insufficient documentation

## 2022-12-21 NOTE — Patient Instructions (Signed)
You are doing well! Normal exam today.   No concerning findings on today's exam. Plan to follow up in six months or sooner if needed.    Symptoms to report to your health care team include vaginal bleeding, rectal bleeding, bloating, weight loss without effort, new and persistent pain, new and  persistent fatigue, new leg swelling, new masses (i.e., bumps in your neck or groin), new and persistent cough, new and persistent nausea and vomiting, change in bowel or bladder habits, and any other concerns.

## 2022-12-22 NOTE — Progress Notes (Signed)
Gynecologic Oncology Follow-up Visit  Chief Complaint:  Chief Complaint  Patient presents with   Endometrial carcinoma [facility])    Assessment/Plan: Diane [name]  is a 72 y.o. female with stage IA grade 3 endometrioid cancer (MSI stable/MMR proficient). She is s/p robotic assisted laparoscopic staging on 06/21/19 with Dr. Adolphus Birchwood. Pathology revealed low risk factors for recurrence, therefore no adjuvant therapy is recommended according to NCCN guidelines.  No findings on today's exam concerning for recurrence. She is advised to follow up in six months or sooner if needed. Symptoms to report listed in instructions of AVS. She is advised to call for any needs, concerns, or new symptoms.  We continue to recommend she have follow-up every 6 months for 5 years (from 06/2019) in accordance with NCCN guidelines. Those visits should include symptom assessment, physical exam and pelvic examination. Pap smears are not indicated or recommended in the routine surveillance of endometrial cancer.  HPI: Diane [name] is a 72 year old P3 who was initially seen in consultation at the request of Dr. Erin Fulling for evaluation of grade 3 endometrial cancer. She developed postmenopausal bleeding in late November 2020 and was seen by her primary care physician. It was determined to be uterine bleeding and she referred her to her gynecologist. She underwent a transvaginal ultrasound scan on June 06, 2019 which revealed a uterus measuring 6.1 x 3.7 x 5.1 cm with a 22 mm endometrial thickness. Right and left ovaries were normal.  She then underwent a transvaginal endometrial pipelle biopsy on June 14, 2019, which revealed high-grade endometrial adenocarcinoma with a differential diagnosis including endometrioid or serous carcinoma. Pap test was normal with negative high-risk HPV performed on October 30, 2018.  The patient underwent a CT scan of the abdomen and pelvis with IV contrast on June 14, 2019 which revealed  a heterogeneous 2.4 cm mass in the fundus of the uterus but no evidence of metastatic disease.  There was some aortic atherosclerosis but no additional findings that were significant.  On 06/26/19, she underwent robotic assisted total hysterectomy, BSO, SLN biopsy with Dr. Adolphus Birchwood. Intraoperative findings were significant for a small normal sized uterus and a left abdominal sebaceous cyst. Surgery was uncomplicated.   Final pathology revealed a FIGO grade 3 stage IA endometrioid endometrial cancer. There was inner half myo invasion (2 of 12mm). There was no LVSI, no cervical/adnexal/nodal inolvement. The tumor was MSI stable/MMR proficient. She met low risk factors for recurrence and therefore no adjuvant therapy was recommended in accordance with NCCN guidelines.  Interval History: She presents today for continued follow up. She has been doing well since her last visit. She continues to stay busy and helps to take care of her grandchildren/great grand children. There have been no changes in her health. She continues to stay very active with her occupation and family. No symptoms of recurrence voiced including vaginal bleeding, change in bowel or bladder habits, new abdominal/pelvic pain. Tolerating diet with no nausea or emesis. No pain reported. She continues to follow with her PCP. She had adjustments to her thyroid medication which helped her energy level significantly. No concerns voiced.   Current Meds:  Outpatient Encounter Medications as of 12/21/2022  Medication Sig   b complex vitamins capsule Take 1 capsule by mouth every evening.    calcium-vitamin D (OSCAL WITH D) 500-200 MG-UNIT tablet Take 1 tablet by mouth every evening.    latanoprost (XALATAN) 0.005 % ophthalmic solution    levothyroxine (SYNTHROID) 137 MCG tablet Take 1 tablet (  137 mcg total) by mouth daily before breakfast.   metroNIDAZOLE (METROCREAM) 0.75 % cream Apply 1 application topically every evening.   Multiple Vitamin  (MULTIVITAMIN WITH MINERALS) TABS tablet Take 1 tablet by mouth every evening.   SULFACLEANSE 8/4 8-4 % SUSP 1 application apply on the skin DAILY  Wash face once a day   No facility-administered encounter medications on file as of 12/21/2022.    Allergy: No Known Allergies  Social Hx:   Social History   Socioeconomic History   Marital status: Married    Spouse name: Not on file   Number of children: Not on file   Years of education: Not on file   Highest education level: Not on file  Occupational History   Not on file  Tobacco Use   Smoking status: Never   Smokeless tobacco: Never  Vaping Use   Vaping status: Never Used  Substance and Sexual Activity   Alcohol use: No   Drug use: No   Sexual activity: Yes    Birth control/protection: None  Other Topics Concern   Not on file  Social History Narrative   Not on file   Social Determinants of Health   Financial Resource Strain: Low Risk  (03/16/2021)   Overall Financial Resource Strain (CARDIA)    Difficulty of Paying Living Expenses: Not hard at all  Food Insecurity: No Food Insecurity (03/23/2022)   Hunger Vital Sign    Worried About Running Out of Food in the Last Year: Never true    Ran Out of Food in the Last Year: Never true  Transportation Needs: No Transportation Needs (03/23/2022)   PRAPARE - Administrator, Civil Service (Medical): No    Lack of Transportation (Non-Medical): No  Physical Activity: Insufficiently Active (03/16/2021)   Exercise Vital Sign    Days of Exercise per Week: 3 days    Minutes of Exercise per Session: 30 min  Stress: No Stress Concern Present (03/16/2021)   [employer] of Occupational Health - Occupational Stress Questionnaire    Feeling of Stress : Not at all  Social Connections: Unknown (09/29/2021)   Received from [facility]   Social Network    Social Network: Not on file  Intimate Partner Violence: Not At Risk (03/23/2022)   Humiliation, Afraid,  Rape, and Kick questionnaire    Fear of Current or Ex-Partner: No    Emotionally Abused: No    Physically Abused: No    Sexually Abused: No    Past Surgical Hx:  Past Surgical History:  Procedure Laterality Date   COLONOSCOPY  2007   Dr. Christella Hartigan   ROBOTIC ASSISTED TOTAL HYSTERECTOMY WITH BILATERAL SALPINGO OOPHERECTOMY Bilateral 06/21/2019   Procedure: XI ROBOTIC ASSISTED TOTAL HYSTERECTOMY WITH BILATERAL SALPINGO OOPHORECTOMY, EXCISION OF LEFT ABDOMINAL WALL MASS;  Surgeon: Adolphus Birchwood, MD;  Location: [facility];  Service: Gynecology;  Laterality: Bilateral;   SENTINEL NODE BIOPSY N/A 06/21/2019   Procedure: SENTINEL NODE BIOPSY;  Surgeon: Adolphus Birchwood, MD;  Location: [facility];  Service: Gynecology;  Laterality: N/A;    Past Medical Hx:  Past Medical History:  Diagnosis Date   Endometrial cancer ([facility])    Hypothyroid    Personal history of colonic polyps    Postmenopausal bleeding    Uterine cancer ([facility])     Past Gynecological History:  See HPI No LMP recorded. Patient is postmenopausal.  Family Hx:  Family History  Problem Relation Age of Onset   Cancer Mother 80  Breast cancer   Other Mother        polymyositis?   COPD Father    Cancer Brother        Throat cancer   Colon cancer Neg Hx    Endometrial cancer Neg Hx    Ovarian cancer Neg Hx    Review of Systems: Review of systems intake form negative for new symptoms. Constitutional: Feels well. No fever, chills, early satiety, unintentional weight loss or gain. Cardiovascular: No chest pain, shortness of breath, or edema.  Pulmonary: No cough or wheeze.  Gastrointestinal: No nausea, vomiting, or diarrhea. No bright red blood per rectum or change in bowel movement.  Genitourinary: No frequency, urgency, or dysuria. No vaginal bleeding or discharge.  Musculoskeletal: No new myalgia or joint pain. Neurologic: No new weakness, numbness, or change in gait.  Psychology: No new symptoms of depression, anxiety, or  insomnia.  Health Maintenance: Mammogram: Last August 2023 Pap Smear: N/A Cologuard: February 2022 Bone Density Scan: August 2022  Vitals:  Blood pressure 119/85, pulse 62, temperature 98 F (36.7 C), temperature source Oral, resp. rate 16, weight 124 lb 12.8 oz (56.6 kg), SpO2 99%.  Physical Exam: General: Well developed, well nourished female in no acute distress. Alert and oriented x 3.  Neck: Supple without any enlargements.  Lymph node survey: No cervical, supraclavicular, or inguinal adenopathy.  Cardiovascular: Regular rate and rhythm. S1 and S2 normal.  Lungs: Clear to auscultation bilaterally. No wheezes/crackles/rhonchi noted.  Skin: No rashes or lesions present. Back: No CVA tenderness.  Abdomen: Abdomen soft, non-tender and non-obese. Active bowel sounds in all quadrants. No evidence of a fluid wave or abdominal masses. Incisions well healed without nodularity noted on palpation. Genitourinary:    Vulva/vagina: Normal external female genitalia. No lesions.    Urethra: No lesions or masses.    Vagina: Vaginal cuff well healed and smooth. No palpable masses. No vaginal bleeding or drainage noted. Cervix and uterus surgically absent. Rectal: Good tone, no masses or nodularity palpated.  Extremities: No bilateral cyanosis, edema, or clubbing.    Doylene Bode, NP  12/22/2022, 11:33 AM

## 2023-03-23 ENCOUNTER — Other Ambulatory Visit: Payer: Self-pay | Admitting: Family Medicine

## 2023-03-23 DIAGNOSIS — E039 Hypothyroidism, unspecified: Secondary | ICD-10-CM

## 2023-06-23 ENCOUNTER — Telehealth: Payer: Self-pay | Admitting: *Deleted

## 2023-06-23 NOTE — Telephone Encounter (Signed)
 Patient called to cancel her appt. With Vira Grieves, NP for 06/28/23. Pt states she will call the office back to reschedule once she has a chance to look at her calendar/schedule.

## 2023-06-28 ENCOUNTER — Ambulatory Visit: Payer: Medicare HMO | Admitting: Gynecologic Oncology

## 2023-10-16 NOTE — Progress Notes (Unsigned)
 [facility] [address] [place], [place] [address] [phone]  Date:  10/17/2023   Name:  Diane [name]   DOB:  May 09, 1951   MRN:  [id number]  PCP:  Kaylee Partridge, MD    Chief Complaint: No chief complaint on file.   History of Present Illness:  Diane [name] is a 73 y.o. very pleasant female patient who presents with the following:  For medication follow-up.  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  Her oldest grandchild actually had twins last year  Levothyroxine  137  Patient Active Problem List   Diagnosis Date Noted   Abdominal wall mass 06/21/2019   Endometrial carcinoma ([facility]) 06/14/2019   Osteopenia 12/01/2018   Hypothyroid 12/29/2012    Past Medical History:  Diagnosis Date   Endometrial cancer ([facility])    Hypothyroid    Personal history of colonic polyps    Postmenopausal bleeding    Uterine cancer [facility])     Past Surgical History:  Procedure Laterality Date   COLONOSCOPY  2007   Dr. Howard Macho   ROBOTIC ASSISTED TOTAL HYSTERECTOMY WITH BILATERAL SALPINGO OOPHERECTOMY Bilateral 06/21/2019   Procedure: XI ROBOTIC ASSISTED TOTAL HYSTERECTOMY WITH BILATERAL SALPINGO OOPHORECTOMY, EXCISION OF LEFT ABDOMINAL WALL MASS;  Surgeon: Alphonso Aschoff, MD;  Location: [facility];  Service: Gynecology;  Laterality: Bilateral;   SENTINEL NODE BIOPSY N/A 06/21/2019   Procedure: SENTINEL NODE BIOPSY;  Surgeon: Alphonso Aschoff, MD;  Location: [facility];   Service: Gynecology;  Laterality: N/A;    Social History   Tobacco Use   Smoking status: Never   Smokeless tobacco: Never  Vaping Use   Vaping status: Never Used  Substance Use Topics   Alcohol use: No   Drug use: No    Family History  Problem Relation Age of Onset   Cancer Mother 85       Breast cancer   Other Mother        polymyositis?   COPD Father    Cancer Brother        Throat cancer   Colon cancer Neg Hx    Endometrial cancer Neg Hx    Ovarian cancer Neg Hx     No Known Allergies  Medication list has been reviewed and updated.  Current Outpatient Medications on File Prior to Visit  Medication Sig Dispense Refill   b complex vitamins capsule Take 1 capsule by mouth every evening.      calcium-vitamin D  (OSCAL WITH D) 500-200 MG-UNIT tablet Take 1 tablet by mouth every evening.      latanoprost (XALATAN) 0.005 % ophthalmic solution      levothyroxine  (SYNTHROID ) 137 MCG tablet TAKE 1 TABLET BY MOUTH DAILY BEFORE BREAKFAST. 90 tablet 2   metroNIDAZOLE (METROCREAM) 0.75 % cream Apply 1 application topically every evening.     Multiple Vitamin (MULTIVITAMIN WITH MINERALS) TABS tablet Take 1 tablet by mouth every evening.     SULFACLEANSE 8/4 8-4 % SUSP 1 application apply on the skin  DAILY  Wash face once a day     No current facility-administered medications on file prior to visit.    Review of Systems:  As per HPI- otherwise negative.   Physical Examination: There were no vitals filed for this visit. There were no vitals filed for this visit. There is no height or weight on file to calculate BMI. Ideal Body Weight:    GEN: no acute distress. HEENT: Atraumatic, Normocephalic.  Ears and Nose: No external deformity. CV: RRR, No M/G/R. No JVD. No thrill. No extra heart sounds. PULM: CTA B, no wheezes, crackles, rhonchi. No retractions. No resp. distress. No accessory muscle use. ABD: S, NT, ND, +BS. No rebound. No HSM. EXTR: No c/c/e PSYCH: Normally  interactive. Conversant.    Assessment and Plan: ***  Signed Gates Kasal, MD

## 2023-10-16 NOTE — Patient Instructions (Incomplete)
 It was good to see you today, I will be in touch with your results when they come back Recommendations: - Continue follow-up with your gynecologic oncologist -Recommend that you get shingles vaccination - Recommend annual flu shot, COVID-vaccine as appropriate  It looks like you are due for an update of Cologuard, mammogram, bone density- ordered these for you today!    Take care, please see me in about 6 months to a year assuming all is well

## 2023-10-17 ENCOUNTER — Ambulatory Visit (INDEPENDENT_AMBULATORY_CARE_PROVIDER_SITE_OTHER): Admitting: Family Medicine

## 2023-10-17 ENCOUNTER — Other Ambulatory Visit: Payer: Self-pay | Admitting: Family Medicine

## 2023-10-17 ENCOUNTER — Encounter: Payer: Self-pay | Admitting: Family Medicine

## 2023-10-17 VITALS — BP 104/62 | HR 73 | Ht 68.0 in | Wt 126.8 lb

## 2023-10-17 DIAGNOSIS — Z1211 Encounter for screening for malignant neoplasm of colon: Secondary | ICD-10-CM

## 2023-10-17 DIAGNOSIS — C541 Malignant neoplasm of endometrium: Secondary | ICD-10-CM | POA: Diagnosis not present

## 2023-10-17 DIAGNOSIS — E2839 Other primary ovarian failure: Secondary | ICD-10-CM

## 2023-10-17 DIAGNOSIS — Z13220 Encounter for screening for lipoid disorders: Secondary | ICD-10-CM | POA: Diagnosis not present

## 2023-10-17 DIAGNOSIS — L719 Rosacea, unspecified: Secondary | ICD-10-CM | POA: Diagnosis not present

## 2023-10-17 DIAGNOSIS — E039 Hypothyroidism, unspecified: Secondary | ICD-10-CM | POA: Diagnosis not present

## 2023-10-17 DIAGNOSIS — Z130 Encounter for screening for diseases of the blood and blood-forming organs and certain disorders involving the immune mechanism: Secondary | ICD-10-CM | POA: Diagnosis not present

## 2023-10-17 DIAGNOSIS — Z131 Encounter for screening for diabetes mellitus: Secondary | ICD-10-CM | POA: Diagnosis not present

## 2023-10-17 DIAGNOSIS — Z1231 Encounter for screening mammogram for malignant neoplasm of breast: Secondary | ICD-10-CM

## 2023-10-17 DIAGNOSIS — R944 Abnormal results of kidney function studies: Secondary | ICD-10-CM

## 2023-10-17 MED ORDER — METRONIDAZOLE 0.75 % EX CREA: 1.0000 | TOPICAL_CREAM | Freq: Every evening | CUTANEOUS | 1 refills | Status: AC

## 2023-10-17 MED ORDER — SULFACLEANSE 8/4 8-4 % EX SUSP: CUTANEOUS | 3 refills | Status: DC

## 2023-10-18 ENCOUNTER — Encounter: Payer: Self-pay | Admitting: Family Medicine

## 2023-10-18 LAB — LIPID PANEL
Cholesterol: 186 mg/dL (ref 0–200)
HDL: 83.6 mg/dL (ref >39.00)
LDL Cholesterol: 79 mg/dL (ref 0–99)
NonHDL: 102.79
Total CHOL/HDL Ratio: 2
Triglycerides: 119 mg/dL (ref 0.0–149.0)
VLDL: 23.8 mg/dL (ref 0.0–40.0)

## 2023-10-18 LAB — CBC
HCT: 39.8 % (ref 36.0–46.0)
Hemoglobin: 13.3 g/dL (ref 12.0–15.0)
MCHC: 33.4 g/dL (ref 30.0–36.0)
MCV: 98.6 fl (ref 78.0–100.0)
Platelets: 235 10*3/uL (ref 150.0–400.0)
RBC: 4.03 Mil/uL (ref 3.87–5.11)
RDW: 14 % (ref 11.5–15.5)
WBC: 5.9 10*3/uL (ref 4.0–10.5)

## 2023-10-18 LAB — COMPREHENSIVE METABOLIC PANEL WITH GFR
ALT: 20 U/L (ref 0–35)
AST: 22 U/L (ref 0–37)
Albumin: 4.3 g/dL (ref 3.5–5.2)
Alkaline Phosphatase: 55 U/L (ref 39–117)
BUN: 18 mg/dL (ref 6–23)
CO2: 29 meq/L (ref 19–32)
Calcium: 9.7 mg/dL (ref 8.4–10.5)
Chloride: 104 meq/L (ref 96–112)
Creatinine, Ser: 1.11 mg/dL (ref 0.40–1.20)
GFR: 50.02 mL/min — ABNORMAL LOW (ref >60.00)
Glucose, Bld: 100 mg/dL — ABNORMAL HIGH (ref 70–99)
Potassium: 4.3 meq/L (ref 3.5–5.1)
Sodium: 142 meq/L (ref 135–145)
Total Bilirubin: 0.3 mg/dL (ref 0.2–1.2)
Total Protein: 6.6 g/dL (ref 6.0–8.3)

## 2023-10-18 LAB — TSH: TSH: 1.44 u[IU]/mL (ref 0.35–5.50)

## 2023-10-18 LAB — HEMOGLOBIN A1C: Hgb A1c MFr Bld: 5.7 % (ref 4.6–6.5)

## 2023-10-18 NOTE — Addendum Note (Signed)
 Addended by: Gates Kasal C on: 10/18/2023 12:20 PM   Modules accepted: Orders

## 2023-10-19 ENCOUNTER — Telehealth: Payer: Self-pay

## 2023-10-19 NOTE — Telephone Encounter (Signed)
 Patient called back to schedule her appointment with Melissa (NP).Diane [name] scheduled for 8/19 at 10am..Diane [name]

## 2023-10-25 DIAGNOSIS — Z1211 Encounter for screening for malignant neoplasm of colon: Secondary | ICD-10-CM | POA: Diagnosis not present

## 2023-10-30 LAB — COLOGUARD: COLOGUARD: NEGATIVE

## 2023-10-31 ENCOUNTER — Encounter: Payer: Self-pay | Admitting: Family Medicine

## 2023-11-14 ENCOUNTER — Ambulatory Visit (HOSPITAL_BASED_OUTPATIENT_CLINIC_OR_DEPARTMENT_OTHER)
Admission: RE | Admit: 2023-11-14 | Discharge: 2023-11-14 | Disposition: A | Discharge status: Home / Self Care | Source: Ambulatory Visit | Attending: Family Medicine | Admitting: Family Medicine

## 2023-11-14 ENCOUNTER — Encounter (HOSPITAL_BASED_OUTPATIENT_CLINIC_OR_DEPARTMENT_OTHER): Payer: Self-pay

## 2023-11-14 ENCOUNTER — Encounter: Payer: Self-pay | Admitting: Family Medicine

## 2023-11-14 DIAGNOSIS — Z1231 Encounter for screening mammogram for malignant neoplasm of breast: Secondary | ICD-10-CM | POA: Diagnosis not present

## 2023-11-14 DIAGNOSIS — E2839 Other primary ovarian failure: Secondary | ICD-10-CM | POA: Insufficient documentation

## 2023-11-14 DIAGNOSIS — Z780 Asymptomatic menopausal state: Secondary | ICD-10-CM | POA: Diagnosis not present

## 2023-11-14 DIAGNOSIS — M8589 Other specified disorders of bone density and structure, multiple sites: Secondary | ICD-10-CM | POA: Diagnosis not present

## 2023-12-14 ENCOUNTER — Other Ambulatory Visit: Payer: Self-pay | Admitting: Family Medicine

## 2023-12-14 DIAGNOSIS — E039 Hypothyroidism, unspecified: Secondary | ICD-10-CM

## 2023-12-20 ENCOUNTER — Encounter: Payer: Self-pay | Admitting: Family Medicine

## 2023-12-20 ENCOUNTER — Other Ambulatory Visit (INDEPENDENT_AMBULATORY_CARE_PROVIDER_SITE_OTHER)

## 2023-12-20 DIAGNOSIS — R944 Abnormal results of kidney function studies: Secondary | ICD-10-CM

## 2023-12-20 LAB — BASIC METABOLIC PANEL WITH GFR
BUN: 14 mg/dL (ref 6–23)
CO2: 32 meq/L (ref 19–32)
Calcium: 9.6 mg/dL (ref 8.4–10.5)
Chloride: 105 meq/L (ref 96–112)
Creatinine, Ser: 0.95 mg/dL (ref 0.40–1.20)
GFR: 60.22 mL/min (ref >60.00)
Glucose, Bld: 86 mg/dL (ref 70–99)
Potassium: 4.8 meq/L (ref 3.5–5.1)
Sodium: 141 meq/L (ref 135–145)

## 2023-12-28 NOTE — Progress Notes (Unsigned)
 Gynecologic Oncology Follow-up Visit  Chief Complaint:  No chief complaint on file.   Assessment/Plan: Diane [name]  is a 73 y.o. female with stage IA grade 3 endometrioid cancer (MSI stable/MMR proficient). She is s/p robotic assisted laparoscopic staging on 06/21/19 with Dr. Maurilio Ship. Pathology revealed low risk factors for recurrence, therefore no adjuvant therapy is recommended according to NCCN guidelines.  No findings on today's exam concerning for recurrence. She is advised to follow up in six months or sooner if needed. Symptoms to report listed in instructions of AVS. She is advised to call for any needs, concerns, or new symptoms.  We continue to recommend she have follow-up every 6 months for 5 years (from 06/2019) in accordance with NCCN guidelines. Those visits should include symptom assessment, physical exam and pelvic examination. Pap smears are not indicated or recommended in the routine surveillance of endometrial cancer.  HPI: Diane [name] is a 73 year old P3 who was initially seen in consultation at the request of Dr. Corene for evaluation of grade 3 endometrial cancer. She developed postmenopausal bleeding in late November 2020 and was seen by her primary care physician. It was determined to be uterine bleeding and she referred her to her gynecologist. She underwent a transvaginal ultrasound scan on June 06, 2019 which revealed a uterus measuring 6.1 x 3.7 x 5.1 cm with a 22 mm endometrial thickness. Right and left ovaries were normal.  She then underwent a transvaginal endometrial pipelle biopsy on June 14, 2019, which revealed high-grade endometrial adenocarcinoma with a differential diagnosis including endometrioid or serous carcinoma. Pap test was normal with negative high-risk HPV performed on October 30, 2018.  The patient underwent a CT scan of the abdomen and pelvis with IV contrast on June 14, 2019 which revealed a heterogeneous 2.4 cm mass in the fundus of  the uterus but no evidence of metastatic disease.  There was some aortic atherosclerosis but no additional findings that were significant.  On 06/26/19, she underwent robotic assisted total hysterectomy, BSO, SLN biopsy with Dr. Maurilio Ship. Intraoperative findings were significant for a small normal sized uterus and a left abdominal sebaceous cyst. Surgery was uncomplicated.   Final pathology revealed a FIGO grade 3 stage IA endometrioid endometrial cancer. There was inner half myo invasion (2 of 12mm). There was no LVSI, no cervical/adnexal/nodal inolvement. The tumor was MSI stable/MMR proficient. She met low risk factors for recurrence and therefore no adjuvant therapy was recommended in accordance with NCCN guidelines.  Interval History: She presents today for continued follow up. She has been doing well since her last visit. She continues to stay busy and helps to take care of her grandchildren/great grand children. There have been no changes in her health. She continues to stay very active with her occupation and family. No symptoms of recurrence voiced including vaginal bleeding, change in bowel or bladder habits, new abdominal/pelvic pain. Tolerating diet with no nausea or emesis. No pain reported. She continues to follow with her PCP. She had adjustments to her thyroid  medication which helped her energy level significantly. No concerns voiced.   Current Meds:  Outpatient Encounter Medications as of 01/03/2024  Medication Sig   b complex vitamins capsule Take 1 capsule by mouth every evening.    calcium-vitamin D  (OSCAL WITH D) 500-200 MG-UNIT tablet Take 1 tablet by mouth every evening.    latanoprost (XALATAN) 0.005 % ophthalmic solution    levothyroxine  (SYNTHROID ) 137 MCG tablet TAKE 1 TABLET BY MOUTH DAILY BEFORE BREAKFAST.  metroNIDAZOLE  (METROCREAM ) 0.75 % cream Apply 1 Application topically every evening.   Multiple Vitamin (MULTIVITAMIN WITH MINERALS) TABS tablet Take 1 tablet by  mouth every evening.   Sulfacetamide Sodium-Sulfur 8-4 % SUSP USES ONCE DAILY   No facility-administered encounter medications on file as of 01/03/2024.    Allergy: No Known Allergies  Social Hx:   Social History   Socioeconomic History   Marital status: Married    Spouse name: Not on file   Number of children: Not on file   Years of education: Not on file   Highest education level: 12th grade  Occupational History   Not on file  Tobacco Use   Smoking status: Never   Smokeless tobacco: Never  Vaping Use   Vaping status: Never Used  Substance and Sexual Activity   Alcohol use: No   Drug use: No   Sexual activity: Yes    Birth control/protection: None  Other Topics Concern   Not on file  Social History Narrative   Not on file   Social Drivers of Health   Financial Resource Strain: Low Risk  (10/12/2023)   Overall Financial Resource Strain (CARDIA)    Difficulty of Paying Living Expenses: Not very hard  Food Insecurity: No Food Insecurity (10/12/2023)   Hunger Vital Sign    Worried About Running Out of Food in the Last Year: Never true    Ran Out of Food in the Last Year: Never true  Transportation Needs: No Transportation Needs (10/12/2023)   PRAPARE - Administrator, Civil Service (Medical): No    Lack of Transportation (Non-Medical): No  Physical Activity: Insufficiently Active (03/16/2021)   Exercise Vital Sign    Days of Exercise per Week: 3 days    Minutes of Exercise per Session: 30 min  Stress: No Stress Concern Present (10/12/2023)   [employer] of Occupational Health - Occupational Stress Questionnaire    Feeling of Stress : Not at all  Social Connections: Moderately Integrated (10/12/2023)   Social Connection and Isolation Panel    Frequency of Communication with Friends and Family: More than three times a week    Frequency of Social Gatherings with Friends and Family: More than three times a week    Attends Religious Services: More than 4  times per year    Active Member of [employer] or Organizations: No    Attends Banker Meetings: Not on file    Marital Status: Married  Intimate Partner Violence: Not At Risk (03/23/2022)   Humiliation, Afraid, Rape, and Kick questionnaire    Fear of Current or Ex-Partner: No    Emotionally Abused: No    Physically Abused: No    Sexually Abused: No    Past Surgical Hx:  Past Surgical History:  Procedure Laterality Date   COLONOSCOPY  2007   Dr. Teressa   ROBOTIC ASSISTED TOTAL HYSTERECTOMY WITH BILATERAL SALPINGO OOPHERECTOMY Bilateral 06/21/2019   Procedure: XI ROBOTIC ASSISTED TOTAL HYSTERECTOMY WITH BILATERAL SALPINGO OOPHORECTOMY, EXCISION OF LEFT ABDOMINAL WALL MASS;  Surgeon: Eloy Herring, MD;  Location: [facility];  Service: Gynecology;  Laterality: Bilateral;   SENTINEL NODE BIOPSY N/A 06/21/2019   Procedure: SENTINEL NODE BIOPSY;  Surgeon: Eloy Herring, MD;  Location: [facility];  Service: Gynecology;  Laterality: N/A;    Past Medical Hx:  Past Medical History:  Diagnosis Date   Endometrial cancer (HCC)    Hypothyroid    Personal history of colonic polyps    Postmenopausal bleeding  Uterine cancer [facility])     Past Gynecological History:  See HPI No LMP recorded. Patient is postmenopausal.  Family Hx:  Family History  Problem Relation Age of Onset   Cancer Mother 7       Breast cancer   Other Mother        polymyositis?   COPD Father    Cancer Brother        Throat cancer   Colon cancer Neg Hx    Endometrial cancer Neg Hx    Ovarian cancer Neg Hx    Review of Systems: Review of systems intake form negative for new symptoms. Constitutional: Feels well. No fever, chills, early satiety, unintentional weight loss or gain. Cardiovascular: No chest pain, shortness of breath, or edema.  Pulmonary: No cough or wheeze.  Gastrointestinal: No nausea, vomiting, or diarrhea. No bright red blood per rectum or change in bowel movement.  Genitourinary: No frequency, urgency,  or dysuria. No vaginal bleeding or discharge.  Musculoskeletal: No new myalgia or joint pain. Neurologic: No new weakness, numbness, or change in gait.  Psychology: No new symptoms of depression, anxiety, or insomnia.  Health Maintenance: Mammogram: 11/14/2023 Pap Smear: N/A Cologuard: 10/25/2023 Bone Density Scan: 11/14/2023 Recent labs on 10/17/2023  Vitals:  There were no vitals taken for this visit.  Physical Exam: General: Well developed, well nourished female in no acute distress. Alert and oriented x 3.  Neck: Supple without any enlargements.  Lymph node survey: No cervical, supraclavicular, or inguinal adenopathy.  Cardiovascular: Regular rate and rhythm. S1 and S2 normal.  Lungs: Clear to auscultation bilaterally. No wheezes/crackles/rhonchi noted.  Skin: No rashes or lesions present. Back: No CVA tenderness.  Abdomen: Abdomen soft, non-tender and non-obese. Active bowel sounds in all quadrants. No evidence of a fluid wave or abdominal masses. Incisions well healed without nodularity noted on palpation. Genitourinary:    Vulva/vagina: Normal external female genitalia. No lesions.    Urethra: No lesions or masses.    Vagina: Vaginal cuff well healed and smooth. No palpable masses. No vaginal bleeding or drainage noted. Cervix and uterus surgically absent. Rectal: Good tone, no masses or nodularity palpated.  Extremities: No bilateral cyanosis, edema, or clubbing.    Diane JONETTA Epps, NP  12/28/2023, 9:07 AM

## 2023-12-28 NOTE — Patient Instructions (Signed)
 You are doing well! Normal exam with no concerning findings today.   Plan to follow up in six months or sooner if needed. If normal exam at this time, you will be five years from diagnosis and will be able to graduate for cancer surveillance exams.    Symptoms to report to your health care team include vaginal bleeding, rectal bleeding, bloating, weight loss without effort, new and persistent pain, new and  persistent fatigue, new leg swelling, new masses (i.e., bumps in your neck or groin), new and persistent cough, new and persistent nausea and vomiting, change in bowel or bladder habits, and any other concerns.

## 2023-12-30 ENCOUNTER — Encounter: Payer: Self-pay | Admitting: Gynecologic Oncology

## 2024-01-03 ENCOUNTER — Inpatient Hospital Stay: Attending: Gynecologic Oncology | Admitting: Gynecologic Oncology

## 2024-01-03 VITALS — BP 101/68 | HR 83 | Temp 98.7°F | Resp 16 | Ht 67.5 in | Wt 122.0 lb

## 2024-01-03 DIAGNOSIS — Z8542 Personal history of malignant neoplasm of other parts of uterus: Secondary | ICD-10-CM | POA: Diagnosis not present

## 2024-01-03 DIAGNOSIS — Z90722 Acquired absence of ovaries, bilateral: Secondary | ICD-10-CM | POA: Insufficient documentation

## 2024-01-03 DIAGNOSIS — C541 Malignant neoplasm of endometrium: Secondary | ICD-10-CM | POA: Diagnosis not present

## 2024-01-03 DIAGNOSIS — Z9079 Acquired absence of other genital organ(s): Secondary | ICD-10-CM | POA: Insufficient documentation

## 2024-01-03 DIAGNOSIS — Z90710 Acquired absence of both cervix and uterus: Secondary | ICD-10-CM | POA: Diagnosis not present

## 2024-02-15 ENCOUNTER — Telehealth: Payer: Self-pay | Admitting: Family Medicine

## 2024-02-15 NOTE — Telephone Encounter (Signed)
 Copied from CRM [id number]. Topic: Medicare AWV >> Feb 15, 2024  2:12 PM Nathanel DEL wrote: Reason for CRM: Called LVM 02/15/2024 to schedule AWV. Please schedule office or virtual visits.  Nathanel Paschal; Care Guide Ambulatory Clinical Support [place] [facility] Direct Dial: [phone]

## 2024-03-23 DIAGNOSIS — H409 Unspecified glaucoma: Secondary | ICD-10-CM | POA: Diagnosis not present

## 2024-04-04 ENCOUNTER — Telehealth: Payer: Self-pay | Admitting: Family Medicine

## 2024-04-04 NOTE — Telephone Encounter (Signed)
 Copied from CRM [id number]. Topic: Medicare AWV >> Apr 04, 2024  9:52 AM Nathanel DEL wrote: Called LVM 04/04/2024 to sched AWV. Please schedule in office or virtual visit.   Nathanel Paschal; Care Guide Ambulatory Clinical Support [place] [facility] Direct Dial: [phone]

## 2024-06-14 ENCOUNTER — Encounter: Payer: Self-pay | Admitting: Gynecologic Oncology

## 2024-06-19 ENCOUNTER — Inpatient Hospital Stay: Admitting: Gynecologic Oncology

## 2024-06-19 VITALS — BP 120/78 | HR 74 | Temp 97.5°F | Resp 19 | Ht 70.0 in | Wt 128.4 lb

## 2024-06-19 DIAGNOSIS — C541 Malignant neoplasm of endometrium: Secondary | ICD-10-CM
